# Patient Record
Sex: Female | Born: 1940
Health system: Southern US, Community
[De-identification: ages and names within clinical notes are randomized; demographics above are authoritative.]

## PROBLEM LIST (undated history)

## (undated) DIAGNOSIS — I00 Rheumatic fever without heart involvement: Secondary | ICD-10-CM

## (undated) DIAGNOSIS — E785 Hyperlipidemia, unspecified: Secondary | ICD-10-CM

## (undated) DIAGNOSIS — K635 Polyp of colon: Secondary | ICD-10-CM

## (undated) DIAGNOSIS — K5790 Diverticulosis of intestine, part unspecified, without perforation or abscess without bleeding: Secondary | ICD-10-CM

## (undated) DIAGNOSIS — G4730 Sleep apnea, unspecified: Secondary | ICD-10-CM

## (undated) DIAGNOSIS — H9190 Unspecified hearing loss, unspecified ear: Secondary | ICD-10-CM

## (undated) DIAGNOSIS — M1990 Unspecified osteoarthritis, unspecified site: Secondary | ICD-10-CM

## (undated) HISTORY — DX: Hyperlipidemia, unspecified: E78.5

## (undated) HISTORY — DX: Unspecified osteoarthritis, unspecified site: M19.90

## (undated) HISTORY — DX: Unspecified hearing loss, unspecified ear: H91.90

## (undated) HISTORY — DX: Diverticulosis of intestine, part unspecified, without perforation or abscess without bleeding: K57.90

## (undated) HISTORY — DX: Sleep apnea, unspecified: G47.30

## (undated) HISTORY — PX: COLONOSCOPY: SHX174

## (undated) HISTORY — DX: Rheumatic fever without heart involvement: I00

## (undated) HISTORY — DX: Polyp of colon: K63.5

---
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
## 1988-07-09 HISTORY — PX: BREAST EXCISIONAL BIOPSY: SUR124

## 1999-07-10 LAB — HM COLONOSCOPY

## 2010-07-05 ENCOUNTER — Ambulatory Visit: Payer: Self-pay | Admitting: Internal Medicine

## 2010-07-05 DIAGNOSIS — H6120 Impacted cerumen, unspecified ear: Secondary | ICD-10-CM | POA: Insufficient documentation

## 2010-07-05 DIAGNOSIS — E785 Hyperlipidemia, unspecified: Secondary | ICD-10-CM | POA: Insufficient documentation

## 2010-07-05 DIAGNOSIS — M1990 Unspecified osteoarthritis, unspecified site: Secondary | ICD-10-CM | POA: Insufficient documentation

## 2010-07-05 HISTORY — DX: Hyperlipidemia, unspecified: E78.5

## 2010-07-20 ENCOUNTER — Encounter: Payer: Self-pay | Admitting: Internal Medicine

## 2010-08-10 NOTE — Letter (Signed)
Summary: Records from [facility] 2009 - 2012  Records from [facility] 2009 - 2012   Imported By: Maryln Gottron 07/27/2010 11:15:56  _____________________________________________________________________  External Attachment:    Type:   Image     Comment:   External Document

## 2010-08-10 NOTE — Letter (Signed)
Summary: Patient Information form  Patient Information form   Imported By: Maryln Gottron 07/14/2010 09:09:32  _____________________________________________________________________  External Attachment:    Type:   Image     Comment:   External Document

## 2010-08-10 NOTE — Assessment & Plan Note (Signed)
Summary: PT TO RE-EST/PT REQ EMP/PT COMING IN FASTING/[facility] rsc appt tim...   Vital Signs:  Patient profile:   70 year old female Height:      65 inches Weight:      141 pounds BMI:     23.55 Temp:     98.4 degrees F oral BP sitting:   110 / 70  (left arm) Cuff size:   regular  Vitals Entered By: Duard Brady LPN (July 05, 2010 8:38 AM) CC: to re-establish  Is Patient Diabetic? No   CC:  to re-establish .  History of Present Illness: 70 -year-old patient who is seen today to establish  with our practice.  she has a long history treated dyslipidemia, and has been on Lipitor 40 mg daily.  No concerns or complaints except for some mild right ear discomfort.  Here for Medicare AWV:  1.   Risk factors based on Past M, S, F history: risk  factors include hypercholesterolemia online 2.   Physical Activities: remains quite active.  Does have some left knee discomfort;  plan on joining the [employer]. 3.   Depression/mood: no history of depression or mood disorder 4.   Hearing: uses hearing aid on the right 5.   ADL's: independent in all aspects of daily living 6.   Fall Risk: low 7.   Home Safety: no problems identified 8.   Height, weight, &visual acuity:height and weight stable.  No difficulty with visual acuity 9.   Counseling: exercise heart healthy diet.  Encouraged 10.   Labs ordered based on risk factors: will check a lipid profile next visit 11.           Referral Coordination-  12.           Care Plan- needs follow up for colonoscopy next year 13.            Cognitive Assessment- alert and oriented normal affect.  No cognitive dysfunction   Preventive Screening-Counseling & Management  Alcohol-Tobacco     Smoking Status: never  Allergies (verified): No Known Drug Allergies  Past History:  Past Medical History: Hyperlipidemia partial deafness history of rheumatic fever Osteoarthritis  Past Surgical History: Tonsillectomy 1944 breast biopsy 1980 colonoscopy  2001  Family History: Reviewed history and no changes required. mother died recently at age 71 history of Crohn's disease and valvular heart disease father is still living at 71 one brother one sister both in good health  Social History: Reviewed history and no changes required. 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  The patient denies anorexia, fever, weight loss, weight gain, vision loss, hoarseness, chest pain, syncope, dyspnea on exertion, peripheral edema, prolonged cough, headaches, hemoptysis, abdominal pain, melena, hematochezia, severe indigestion/heartburn, hematuria, incontinence, genital sores, muscle weakness, suspicious skin lesions, transient blindness, difficulty walking, depression, unusual weight change, abnormal bleeding, enlarged lymph nodes, angioedema, and breast masses.         also complains of some right ear discomfort  Physical Exam  General:  Well-developed,well-nourished,in no acute distress; alert,appropriate and cooperative throughout examination; 110/70 Head:  Normocephalic and atraumatic without obvious abnormalities. No apparent alopecia or balding. Eyes:  No corneal or conjunctival inflammation noted. EOMI. Perrla. Funduscopic exam benign, without hemorrhages, exudates or papilledema. Vision grossly normal. Ears:  External ear  exam shows no significant lesions or deformities.  Otoscopic examination reveals clear canals, tympanic membranes are intact bilaterally without bulging, retraction, inflammation or discharge. Hearing is grossly normal bilaterally. right canal occluded with cerumen Nose:  External nasal examination shows no deformity or inflammation. Nasal mucosa are pink and moist without lesions or exudates. Mouth:  Oral  mucosa and oropharynx without lesions or exudates.  Teeth in good repair. Neck:  No deformities, masses, or tenderness noted. Chest Wall:  No deformities, masses, or tenderness noted. Breasts:  No mass, nodules, thickening, tenderness, bulging, retraction, inflamation, nipple discharge or skin changes noted.   Lungs:  Normal respiratory effort, chest expands symmetrically. Lungs are clear to auscultation, no crackles or wheezes. Heart:  Normal rate and regular rhythm. S1 and S2 normal without gallop, murmur, click, rub or other extra sounds. Abdomen:  Bowel sounds positive,abdomen soft and non-tender without masses, organomegaly or hernias noted. Msk:  No deformity or scoliosis noted of thoracic or lumbar spine.   Pulses:  R and L carotid,radial,femoral,dorsalis pedis and posterior tibial pulses are full and equal bilaterally Extremities:  No clubbing, cyanosis, edema, or deformity noted with normal full range of motion of all joints.   Neurologic:  No cranial nerve deficits noted. Station and gait are normal. Plantar reflexes are down-going bilaterally. DTRs are symmetrical throughout. Sensory, motor and coordinative functions appear intact. Skin:  Intact without suspicious lesions or rashes Cervical Nodes:  No lymphadenopathy noted Axillary Nodes:  No palpable lymphadenopathy Inguinal Nodes:  No significant adenopathy Psych:  Cognition and judgment appear intact. Alert and cooperative with normal attention span and concentration. No apparent delusions, illusions, hallucinations   Impression & Recommendations:  Problem # 1:  Preventive Health Care (ICD-V70.0)  Orders: Medicare -1st Annual Wellness Visit [phone])  Problem # 2:  HYPERLIPIDEMIA (ICD-272.4)  Her updated medication list for this problem includes:    Lipitor 40 Mg Tabs (Atorvastatin calcium) ..... Qday  Her updated medication list for this problem includes:    Lipitor 40 Mg Tabs (Atorvastatin calcium) ..... Qday  Problem #  3:  OSTEOARTHRITIS (ICD-715.90)  Her updated medication list for this problem includes:    Aspirin Low Dose 81 Mg Tbec (Aspirin) ..... Qday  Her updated medication list for this problem includes:    Aspirin Low Dose 81 Mg Tbec (Aspirin) ..... Qday  Complete Medication List: 1)  Lipitor 40 Mg Tabs (Atorvastatin calcium) .... Qday 2)  Daily-vitamin Tabs (Multiple vitamin) .... Qday 3)  Vitamin D3 1000 Unit Tabs (Cholecalciferol) .... Qday 4)  Omega 3 1000  .... Two times a day 5)  Calcium 500 Mg Tabs (Calcium) .... Qday 6)  Glucosamine 500 Mg Tabs (Glucosamine) .... Two times a day 7)  Lutein 20 Mg Caps (Lutein) .... Qday 8)  Coq-10 200 Mg Caps (Coenzyme q10) .... Qday 9)  Magnesium 200 Mg Tabs (Magnesium) .... 2 qday 10)  Horsetail Extract/silica 500/35 Mg  .... Qday 11)  Aspirin Low Dose 81 Mg Tbec (Aspirin) .... Qday  Patient Instructions: 1)  Please schedule a follow-up appointment in 4 months. 2)  Advised not to eat any food or drink any liquids after 10 PM the night before your procedure. 3)  Schedule a colonoscopy/sigmoidoscopy to help detect colon cancer. 4)  Take calcium +Vitamin D daily. Prescriptions: LIPITOR 40 MG TABS (ATORVASTATIN CALCIUM) qday  #90 x 6   Entered and Authorized by:   Gordy Savers  MD   Signed by:   Gordy Savers  MD on  07/05/2010   Method used:   Print then Give to Patient   RxID:   [id number]    Orders Added: 1)  Medicare -1st Annual Wellness Visit [G0438] 2)  Est. Patient Level III [57322]   Immunization History:  Influenza Immunization History:    Influenza:  historical (05/09/2010)   Immunization History:  Influenza Immunization History:    Influenza:  Historical (05/09/2010)

## 2010-09-01 ENCOUNTER — Other Ambulatory Visit: Payer: Self-pay | Admitting: Internal Medicine

## 2010-09-01 DIAGNOSIS — Z1231 Encounter for screening mammogram for malignant neoplasm of breast: Secondary | ICD-10-CM

## 2010-09-13 ENCOUNTER — Ambulatory Visit
Admission: RE | Admit: 2010-09-13 | Discharge: 2010-09-13 | Disposition: A | Discharge status: Home / Self Care | Payer: BC Managed Care – PPO | Source: Ambulatory Visit | Attending: Internal Medicine | Admitting: Internal Medicine

## 2010-09-13 DIAGNOSIS — Z1231 Encounter for screening mammogram for malignant neoplasm of breast: Secondary | ICD-10-CM

## 2010-10-27 ENCOUNTER — Encounter: Payer: Self-pay | Admitting: Internal Medicine

## 2010-11-01 ENCOUNTER — Ambulatory Visit (INDEPENDENT_AMBULATORY_CARE_PROVIDER_SITE_OTHER): Payer: Medicare Other | Admitting: Internal Medicine

## 2010-11-01 ENCOUNTER — Encounter: Payer: Self-pay | Admitting: Internal Medicine

## 2010-11-01 DIAGNOSIS — M1990 Unspecified osteoarthritis, unspecified site: Secondary | ICD-10-CM

## 2010-11-01 DIAGNOSIS — E785 Hyperlipidemia, unspecified: Secondary | ICD-10-CM

## 2010-11-01 LAB — HEPATIC FUNCTION PANEL
ALT: 31 U/L (ref 0–35)
AST: 29 U/L (ref 0–37)
Albumin: 3.7 g/dL (ref 3.5–5.2)
Alkaline Phosphatase: 85 U/L (ref 39–117)
Total Protein: 6.7 g/dL (ref 6.0–8.3)

## 2010-11-01 LAB — CBC WITH DIFFERENTIAL/PLATELET
Eosinophils Relative: 2.6 % (ref 0.0–5.0)
HCT: 42.9 % (ref 36.0–46.0)
Hemoglobin: 14.6 g/dL (ref 12.0–15.0)
Lymphs Abs: 2.4 10*3/uL (ref 0.7–4.0)
Monocytes Relative: 6.5 % (ref 3.0–12.0)
Platelets: 257 10*3/uL (ref 150.0–400.0)
WBC: 6.2 10*3/uL (ref 4.5–10.5)

## 2010-11-01 LAB — BASIC METABOLIC PANEL
CO2: 30 mEq/L (ref 19–32)
Calcium: 9.5 mg/dL (ref 8.4–10.5)
Chloride: 105 mEq/L (ref 96–112)
Glucose, Bld: 80 mg/dL (ref 70–99)
Sodium: 142 mEq/L (ref 135–145)

## 2010-11-01 LAB — TSH: TSH: 1.35 u[IU]/mL (ref 0.35–5.50)

## 2010-11-01 LAB — LIPID PANEL: HDL: 46.5 mg/dL (ref >39.00)

## 2010-11-01 NOTE — Patient Instructions (Signed)
Limit your sodium (Salt) intake    It is important that you exercise regularly, at least 20 minutes 3 to 4 times per week.  If you develop chest pain or shortness of breath seek  medical attention.  Return in one year for follow-up   

## 2010-11-02 ENCOUNTER — Encounter: Payer: Self-pay | Admitting: Internal Medicine

## 2010-11-02 NOTE — Progress Notes (Signed)
  Subjective:    Patient ID: Tina Smith, female    DOB: 06/28/1941, 70 y.o.   MRN: [id number]  HPI  70 year old patient who is in today for followup. She has a history of dyslipidemia she remains on Lipitor 40 mg daily she is here today for fasting lab. She has a history of mild osteoarthritis which has been stable she remains on calcium and vitamin D supplementation she is doing well today without concerns or complaints.    Review of Systems  Constitutional: Negative.   HENT: Negative for hearing loss, congestion, sore throat, rhinorrhea, dental problem, sinus pressure and tinnitus.   Eyes: Negative for pain, discharge and visual disturbance.  Respiratory: Negative for cough and shortness of breath.   Cardiovascular: Negative for chest pain, palpitations and leg swelling.  Gastrointestinal: Negative for nausea, vomiting, abdominal pain, diarrhea, constipation, blood in stool and abdominal distention.  Genitourinary: Negative for dysuria, urgency, frequency, hematuria, flank pain, vaginal bleeding, vaginal discharge, difficulty urinating, vaginal pain and pelvic pain.  Musculoskeletal: Negative for joint swelling, arthralgias and gait problem.  Skin: Negative for rash.  Neurological: Negative for dizziness, syncope, speech difficulty, weakness, numbness and headaches.  Hematological: Negative for adenopathy.  Psychiatric/Behavioral: Negative for behavioral problems, dysphoric mood and agitation. The patient is not nervous/anxious.        Objective:   Physical Exam  Constitutional: She is oriented to person, place, and time. She appears well-developed and well-nourished.  HENT:  Head: Normocephalic.  Right Ear: External ear normal.  Left Ear: External ear normal.  Mouth/Throat: Oropharynx is clear and moist.  Eyes: Conjunctivae and EOM are normal. Pupils are equal, round, and reactive to light.  Neck: Normal range of motion. Neck supple. No thyromegaly present.  Cardiovascular:  Normal rate, regular rhythm, normal heart sounds and intact distal pulses.   Pulmonary/Chest: Effort normal and breath sounds normal.  Abdominal: Soft. Bowel sounds are normal. She exhibits no mass. There is no tenderness.  Musculoskeletal: Normal range of motion.  Lymphadenopathy:    She has no cervical adenopathy.  Neurological: She is alert and oriented to person, place, and time.  Skin: Skin is warm and dry. No rash noted.  Psychiatric: She has a normal mood and affect. Her behavior is normal.          Assessment & Plan:  Dyslipidemia. Patient is tolerating atorvastatin well. We'll check a fasting lipid profile Osteoarthritis. Clinically stable  We'll continue exercise calcium and vitamin D supplementation laboratory studies will be checked. We'll see in one year for a complete exam

## 2010-11-16 ENCOUNTER — Telehealth: Payer: Self-pay | Admitting: Internal Medicine

## 2010-11-16 NOTE — Telephone Encounter (Signed)
Attempt to call - Ans mach at hm# - LMTCB if questions - labs being mailed

## 2010-11-16 NOTE — Telephone Encounter (Signed)
Pt stated that Dr Kirtland Bouchard was going to get the nurse to mail her labs results from 11-01-2010. She is checking on the status.

## 2011-04-09 HISTORY — PX: CYSTOCELE REPAIR: SHX163

## 2011-04-24 ENCOUNTER — Ambulatory Visit (INDEPENDENT_AMBULATORY_CARE_PROVIDER_SITE_OTHER): Payer: Medicare Other

## 2011-04-24 DIAGNOSIS — Z23 Encounter for immunization: Secondary | ICD-10-CM

## 2011-08-16 DIAGNOSIS — L821 Other seborrheic keratosis: Secondary | ICD-10-CM | POA: Diagnosis not present

## 2011-08-16 DIAGNOSIS — L819 Disorder of pigmentation, unspecified: Secondary | ICD-10-CM | POA: Diagnosis not present

## 2011-08-16 DIAGNOSIS — D485 Neoplasm of uncertain behavior of skin: Secondary | ICD-10-CM | POA: Diagnosis not present

## 2011-08-16 DIAGNOSIS — D235 Other benign neoplasm of skin of trunk: Secondary | ICD-10-CM | POA: Diagnosis not present

## 2011-08-16 DIAGNOSIS — L439 Lichen planus, unspecified: Secondary | ICD-10-CM | POA: Diagnosis not present

## 2011-08-16 DIAGNOSIS — L570 Actinic keratosis: Secondary | ICD-10-CM | POA: Diagnosis not present

## 2011-08-17 ENCOUNTER — Other Ambulatory Visit: Payer: Self-pay | Admitting: Internal Medicine

## 2011-08-17 DIAGNOSIS — Z1239 Encounter for other screening for malignant neoplasm of breast: Secondary | ICD-10-CM

## 2011-09-18 ENCOUNTER — Ambulatory Visit
Admission: RE | Admit: 2011-09-18 | Discharge: 2011-09-18 | Disposition: A | Discharge status: Home / Self Care | Payer: Medicare Other | Source: Ambulatory Visit | Attending: Internal Medicine | Admitting: Internal Medicine

## 2011-09-18 DIAGNOSIS — Z1239 Encounter for other screening for malignant neoplasm of breast: Secondary | ICD-10-CM

## 2011-09-18 DIAGNOSIS — Z1231 Encounter for screening mammogram for malignant neoplasm of breast: Secondary | ICD-10-CM | POA: Diagnosis not present

## 2012-01-03 ENCOUNTER — Encounter: Payer: Self-pay | Admitting: Internal Medicine

## 2012-01-03 ENCOUNTER — Ambulatory Visit (INDEPENDENT_AMBULATORY_CARE_PROVIDER_SITE_OTHER): Payer: Medicare Other | Admitting: Internal Medicine

## 2012-01-03 VITALS — BP 114/70 | HR 76 | Temp 97.7°F | Ht 65.0 in | Wt 140.0 lb

## 2012-01-03 DIAGNOSIS — M1990 Unspecified osteoarthritis, unspecified site: Secondary | ICD-10-CM | POA: Diagnosis not present

## 2012-01-03 DIAGNOSIS — E785 Hyperlipidemia, unspecified: Secondary | ICD-10-CM

## 2012-01-03 DIAGNOSIS — R5381 Other malaise: Secondary | ICD-10-CM

## 2012-01-03 DIAGNOSIS — R5383 Other fatigue: Secondary | ICD-10-CM

## 2012-01-03 DIAGNOSIS — Z0000 Encounter for general adult medical examination without abnormal findings: Secondary | ICD-10-CM

## 2012-01-03 LAB — CBC WITH DIFFERENTIAL/PLATELET
Basophils Absolute: 0 10*3/uL (ref 0.0–0.1)
Eosinophils Absolute: 0.1 10*3/uL (ref 0.0–0.7)
Lymphocytes Relative: 32.7 % (ref 12.0–46.0)
MCHC: 33.2 g/dL (ref 30.0–36.0)
Monocytes Relative: 6.5 % (ref 3.0–12.0)
Neutrophils Relative %: 57.9 % (ref 43.0–77.0)
RBC: 4.62 Mil/uL (ref 3.87–5.11)
RDW: 13.7 % (ref 11.5–14.6)

## 2012-01-03 LAB — COMPREHENSIVE METABOLIC PANEL
ALT: 20 U/L (ref 0–35)
AST: 27 U/L (ref 0–37)
Albumin: 3.7 g/dL (ref 3.5–5.2)
CO2: 27 mEq/L (ref 19–32)
Calcium: 9.4 mg/dL (ref 8.4–10.5)
Chloride: 108 mEq/L (ref 96–112)
GFR: 57.92 mL/min — ABNORMAL LOW (ref >60.00)
Potassium: 4.1 mEq/L (ref 3.5–5.1)
Total Protein: 6.6 g/dL (ref 6.0–8.3)

## 2012-01-03 LAB — LIPID PANEL
Cholesterol: 278 mg/dL — ABNORMAL HIGH (ref 0–200)
HDL: 44.6 mg/dL (ref >39.00)
Total CHOL/HDL Ratio: 6
Triglycerides: 156 mg/dL — ABNORMAL HIGH (ref 0.0–149.0)
VLDL: 31.2 mg/dL (ref 0.0–40.0)

## 2012-01-03 LAB — SEDIMENTATION RATE: Sed Rate: 10 mm/hr (ref 0–22)

## 2012-01-03 NOTE — Progress Notes (Signed)
Subjective:    Patient ID: Tina Smith, female    DOB: 1940-12-21, 71 y.o.   MRN: [id number]  HPI 71 year-old patient who is in today for followup and an annual health examination. She has a history of osteoarthritis as well as dyslipidemia. She discontinued atorvastatin 3 months ago due to chronic fatigue.  The fatigue has been present for greater than 30 years and she states it is worse perhaps about 10 years ago. Approximately 30 years ago she was seen by psychiatry for possible depression and was treated with Prozac. She states that it was felt that she did not have depression. She is felt no improvement since discontinuation of statin therapy 3 months ago. She's also stopped calcium and vitamin D supplementation. She has been seen by gynecology for a cystocele and was concerned about calcium aggravating constipation issues. Her last colonoscopy 2001.  Here for Medicare AWV:   1. Risk factors based on Past M, S, F history: risk factors include hypercholesterolemia online  2. Physical Activities: remains quite active. Does have some left knee discomfort; plan on joining the Y.  3. Depression/mood: no history of depression or mood disorder;  does complain of chronic fatigue and greater than 30 years duration 4. Hearing: uses hearing aid on the right  5. ADL's: independent in all aspects of daily living  6. Fall Risk: low  7. Home Safety: no problems identified  8. Height, weight, &visual acuity:height and weight stable. No difficulty with visual acuity  9. Counseling: exercise heart healthy diet. Encouraged  10. Labs ordered based on risk factors: will check a lipid profile next visit  11. Referral Coordination-  is followup colonoscopy 12. Care Plan- needs follow up for colonoscopy next year  13. Cognitive Assessment- alert and oriented normal affect. No cognitive dysfunction   Preventive Screening-Counseling & Management  Alcohol-Tobacco  Smoking Status: never   Allergies  (verified):  No Known Drug Allergies   Past History:  Past Medical History:  Hyperlipidemia  partial deafness  history of rheumatic fever  Osteoarthritis   Past Surgical History:  Tonsillectomy 1944  breast biopsy 1980  colonoscopy 2001  Cystocele  Family History:  Reviewed history and no changes required.  mother died recently at age 68 history of Crohn's disease and valvular heart disease  father is still living at 83 one brother one sister both in good health   Social History:  Reviewed history and no changes required.  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 HYPERLIPIDEMIA 07/05/2010  . OSTEOARTHRITIS 07/05/2010  . Deafness     partial  . Rheumatic fever     hx    History   Social History  . Marital Status: Married    Spouse Name: N/A    Number of Children: N/A  . Years of Education: N/A   Occupational History  . Not on file.   Social History Main Topics  . Smoking status: Never Smoker   . Smokeless tobacco: Not on file  . Alcohol Use:   . Drug Use:   . Sexually Active:    Other Topics Concern  . Not on file   Social History Narrative  . No narrative on file    Past Surgical History  Procedure Date  . Tonsillectomy   . Breast surgery  bx    Family History  Problem Relation Age of Onset  . Heart disease Mother     No Known Allergies  Current Outpatient Prescriptions on File Prior to Visit  Medication Sig Dispense Refill  . aspirin 81 MG tablet Take 81 mg by mouth daily.        . Cholecalciferol (VITAMIN D) 1000 UNITS capsule Take 1,000 Units by mouth daily.        . Coenzyme Q10 (COQ-10) 200 MG CAPS Take 1 tablet by mouth daily.        . Glucosamine 500 MG TABS Take by mouth daily.        . Lutein 20 MG CAPS Take by mouth daily.         . Magnesium 200 MG TABS Take 2 tablets by mouth daily.        . Multiple Vitamin (MULTIVITAMIN) tablet Take 1 tablet by mouth daily.          BP 114/70  Pulse 76  Temp 97.7 F (36.5 C) (Oral)  Ht 5\' 5"  (1.651 m)  Wt 140 lb (63.504 kg)  BMI 23.30 kg/m2       Review of Systems  Constitutional: Positive for fatigue. Negative for fever, appetite change and unexpected weight change.  HENT: Negative for hearing loss, ear pain, nosebleeds, congestion, sore throat, rhinorrhea, mouth sores, trouble swallowing, neck stiffness, dental problem, voice change, sinus pressure and tinnitus.   Eyes: Negative for photophobia, pain, discharge, redness and visual disturbance.  Respiratory: Negative for cough, chest tightness and shortness of breath.   Cardiovascular: Negative for chest pain, palpitations and leg swelling.  Gastrointestinal: Negative for nausea, vomiting, abdominal pain, diarrhea, constipation, blood in stool, abdominal distention and rectal pain.  Genitourinary: Negative for dysuria, urgency, frequency, hematuria, flank pain, vaginal bleeding, vaginal discharge, difficulty urinating, genital sores, vaginal pain, menstrual problem and pelvic pain.  Musculoskeletal: Negative for back pain, joint swelling, arthralgias and gait problem.  Skin: Negative for rash.  Neurological: Positive for weakness. Negative for dizziness, syncope, speech difficulty, light-headedness, numbness and headaches.  Hematological: Negative for adenopathy. Does not bruise/bleed easily.  Psychiatric/Behavioral: Negative for suicidal ideas, behavioral problems, self-injury, dysphoric mood and agitation. The patient is not nervous/anxious.        Objective:   Physical Exam  Constitutional: She is oriented to person, place, and time. She appears well-developed and well-nourished.  HENT:  Head: Normocephalic and atraumatic.  Right Ear: External ear normal.  Left Ear: External ear normal.  Mouth/Throat:  Oropharynx is clear and moist.       Hearing aid present on the right  Eyes: Conjunctivae and EOM are normal. Pupils are equal, round, and reactive to light.  Neck: Normal range of motion. Neck supple. No JVD present. No thyromegaly present.  Cardiovascular: Normal rate, regular rhythm, normal heart sounds and intact distal pulses.   No murmur heard.      Slight decrease left dorsalis pedis pulse  Pulmonary/Chest: Effort normal and breath sounds normal. She has no wheezes. She has no rales.  Abdominal: Soft. Bowel sounds are normal. She exhibits no distension and no mass. There is no tenderness. There is no rebound and no guarding.  Musculoskeletal: Normal range of motion. She exhibits no edema and no tenderness.  Lymphadenopathy:    She has no cervical adenopathy.  Neurological: She is alert and oriented to person, place, and time. She has normal reflexes. No cranial nerve deficit. She exhibits normal muscle tone. Coordination normal.  Skin: Skin  is warm and dry. No rash noted.  Psychiatric: She has a normal mood and affect. Her behavior is normal.          Assessment & Plan:  Dyslipidemia. We'll check a fasting lipid profile Osteoarthritis. Clinically stable  We'll check a followup colonoscopy Schedule a bone density study  We'll continue exercise calcium and vitamin D supplementation laboratory studies will be checked. We'll see in one year for a complete exam

## 2012-01-03 NOTE — Patient Instructions (Signed)
Colonoscopy and bone density study as discussed  Take a calcium supplement, plus [phone] units of vitamin D    It is important that you exercise regularly, at least 20 minutes 3 to 4 times per week.  If you develop chest pain or shortness of breath seek  medical attention.  Return in one year for follow-up

## 2012-01-04 ENCOUNTER — Encounter: Payer: Self-pay | Admitting: Internal Medicine

## 2012-01-14 ENCOUNTER — Telehealth: Payer: Self-pay | Admitting: Internal Medicine

## 2012-01-14 NOTE — Telephone Encounter (Signed)
Pt would like blood work results °

## 2012-01-29 NOTE — Telephone Encounter (Signed)
Pease advise.

## 2012-01-29 NOTE — Telephone Encounter (Signed)
Please call/notify patient that lab/test/procedure is normal except cholesterol is up to 279. This is 100 points greater than her last total cholesterol. Suggest  she resume atorvastatin. Call in a prescription if needed

## 2012-01-29 NOTE — Telephone Encounter (Signed)
Attempt to call - no ans no mach "please enter a remote access code" - i have no code to enter. Will try tomorrow

## 2012-01-30 NOTE — Telephone Encounter (Signed)
I have been unable to reach by phone - letter sent with results

## 2012-02-12 ENCOUNTER — Ambulatory Visit (AMBULATORY_SURGERY_CENTER): Payer: Medicare Other | Admitting: *Deleted

## 2012-02-12 ENCOUNTER — Ambulatory Visit (INDEPENDENT_AMBULATORY_CARE_PROVIDER_SITE_OTHER)
Admission: RE | Admit: 2012-02-12 | Discharge: 2012-02-12 | Disposition: A | Discharge status: Home / Self Care | Payer: Medicare Other | Source: Ambulatory Visit

## 2012-02-12 VITALS — Ht 65.0 in | Wt 140.1 lb

## 2012-02-12 DIAGNOSIS — M810 Age-related osteoporosis without current pathological fracture: Secondary | ICD-10-CM

## 2012-02-12 DIAGNOSIS — Z0000 Encounter for general adult medical examination without abnormal findings: Secondary | ICD-10-CM

## 2012-02-12 DIAGNOSIS — Z1211 Encounter for screening for malignant neoplasm of colon: Secondary | ICD-10-CM

## 2012-02-12 MED ORDER — MOVIPREP 100 G PO SOLR: ORAL | Status: DC

## 2012-02-14 DIAGNOSIS — H2510 Age-related nuclear cataract, unspecified eye: Secondary | ICD-10-CM | POA: Diagnosis not present

## 2012-02-14 DIAGNOSIS — H43399 Other vitreous opacities, unspecified eye: Secondary | ICD-10-CM | POA: Diagnosis not present

## 2012-02-26 ENCOUNTER — Ambulatory Visit (AMBULATORY_SURGERY_CENTER): Payer: Medicare Other | Admitting: Internal Medicine

## 2012-02-26 ENCOUNTER — Encounter: Payer: Self-pay | Admitting: Internal Medicine

## 2012-02-26 VITALS — BP 140/83 | HR 56 | Temp 96.0°F | Resp 15 | Ht 65.0 in | Wt 140.0 lb

## 2012-02-26 DIAGNOSIS — Z1211 Encounter for screening for malignant neoplasm of colon: Secondary | ICD-10-CM | POA: Diagnosis not present

## 2012-02-26 DIAGNOSIS — K5730 Diverticulosis of large intestine without perforation or abscess without bleeding: Secondary | ICD-10-CM

## 2012-02-26 DIAGNOSIS — D126 Benign neoplasm of colon, unspecified: Secondary | ICD-10-CM | POA: Diagnosis not present

## 2012-02-26 DIAGNOSIS — G4733 Obstructive sleep apnea (adult) (pediatric): Secondary | ICD-10-CM | POA: Diagnosis not present

## 2012-02-26 DIAGNOSIS — K635 Polyp of colon: Secondary | ICD-10-CM

## 2012-02-26 DIAGNOSIS — K648 Other hemorrhoids: Secondary | ICD-10-CM

## 2012-02-26 DIAGNOSIS — K5790 Diverticulosis of intestine, part unspecified, without perforation or abscess without bleeding: Secondary | ICD-10-CM

## 2012-02-26 DIAGNOSIS — D128 Benign neoplasm of rectum: Secondary | ICD-10-CM | POA: Diagnosis not present

## 2012-02-26 DIAGNOSIS — D129 Benign neoplasm of anus and anal canal: Secondary | ICD-10-CM

## 2012-02-26 DIAGNOSIS — M810 Age-related osteoporosis without current pathological fracture: Secondary | ICD-10-CM | POA: Diagnosis not present

## 2012-02-26 DIAGNOSIS — E785 Hyperlipidemia, unspecified: Secondary | ICD-10-CM | POA: Diagnosis not present

## 2012-02-26 HISTORY — DX: Diverticulosis of intestine, part unspecified, without perforation or abscess without bleeding: K57.90

## 2012-02-26 HISTORY — DX: Polyp of colon: K63.5

## 2012-02-26 MED ORDER — SODIUM CHLORIDE 0.9 % IV SOLN: 500.0000 mL | INTRAVENOUS | Status: DC

## 2012-02-26 NOTE — Patient Instructions (Addendum)
Three small polyps were removed. I think they are benign and not a problem. I will send a letter with pathology results and plans. You also have diverticulosis and hemorrhoids.  Thank you for choosing me and [facility].  Iva Boop, MD, FACG YOU HAD AN ENDOSCOPIC PROCEDURE TODAY AT THE [facility]: Refer to the procedure report that was given to you for any specific questions about what was found during the examination.  If the procedure report does not answer your questions, please call your gastroenterologist to clarify.  If you requested that your care partner not be given the details of your procedure findings, then the procedure report has been included in a sealed envelope for you to review at your convenience later.  YOU SHOULD EXPECT: Some feelings of bloating in the abdomen. Passage of more gas than usual.  Walking can help get rid of the air that was put into your GI tract during the procedure and reduce the bloating. If you had a lower endoscopy (such as a colonoscopy or flexible sigmoidoscopy) you may notice spotting of blood in your stool or on the toilet paper. If you underwent a bowel prep for your procedure, then you may not have a normal bowel movement for a few days.  DIET: Your first meal following the procedure should be a light meal and then it is ok to progress to your normal diet.  A half-sandwich or bowl of soup is an example of a good first meal.  Heavy or fried foods are harder to digest and may make you feel nauseous or bloated.  Likewise meals heavy in dairy and vegetables can cause extra gas to form and this can also increase the bloating.  Drink plenty of fluids but you should avoid alcoholic beverages for 24 hours.  ACTIVITY: Your care partner should take you home directly after the procedure.  You should plan to take it easy, moving slowly for the rest of the day.  You can resume normal activity the day after the procedure however you should  NOT DRIVE or use heavy machinery for 24 hours (because of the sedation medicines used during the test).    SYMPTOMS TO REPORT IMMEDIATELY: A gastroenterologist can be reached at any hour.  During normal business hours, 8:30 AM to 5:00 PM Monday through Friday, call [phone].  After hours and on weekends, please call the GI answering service at [phone] who will take a message and have the physician on call contact you.   Following lower endoscopy (colonoscopy or flexible sigmoidoscopy):  Excessive amounts of blood in the stool  Significant tenderness or worsening of abdominal pains  Swelling of the abdomen that is new, acute  Fever of 100F or higher  Following upper endoscopy (EGD)  Vomiting of blood or coffee ground material  New chest pain or pain under the shoulder blades  Painful or persistently difficult swallowing  New shortness of breath  Fever of 100F or higher  Black, tarry-looking stools  FOLLOW UP: If any biopsies were taken you will be contacted by phone or by letter within the next 1-3 weeks.  Call your gastroenterologist if you have not heard about the biopsies in 3 weeks.  Our staff will call the home number listed on your records the next business day following your procedure to check on you and address any questions or concerns that you may have at that time regarding the information given to you following your procedure. This is a Research officer, political party  call and so if there is no answer at the home number and we have not heard from you through the emergency physician on call, we will assume that you have returned to your regular daily activities without incident.  SIGNATURES/CONFIDENTIALITY: You and/or your care partner have signed paperwork which will be entered into your electronic medical record.  These signatures attest to the fact that that the information above on your After Visit Summary has been reviewed and is understood.  Full responsibility of the confidentiality  of this discharge information lies with you and/or your care-partner.

## 2012-02-26 NOTE — Op Note (Signed)
[facility] [address] [place], [address]   COLONOSCOPY PROCEDURE REPORT  PATIENT: Tina Smith, Tina Smith  MR#: [id number] BIRTHDATE: [birth year] , 71  yrs. old GENDER: Female ENDOSCOPIST: Iva Boop, MD, [facility] REFERRED QI:ONGEX Amador Cunas, M.D. PROCEDURE DATE:  02/26/2012 PROCEDURE:   Colonoscopy with cold biopsy polypectomy ASA CLASS:   Class II INDICATIONS:average risk screening. MEDICATIONS: propofol (Diprivan) 250mg  IV, MAC sedation, administered by CRNA, and These medications were titrated to patient response per physician's verbal order  DESCRIPTION OF PROCEDURE:   After the risks benefits and alternatives of the procedure were thoroughly explained, informed consent was obtained.  A digital rectal exam revealed no abnormalities of the rectum.   The LB [id number]  endoscope was introduced through the anus and advanced to the cecum, which was identified by both the appendix and ileocecal valve. No adverse events experienced.   The quality of the prep was excellent, using MoviPrep  The instrument was then slowly withdrawn as the colon was fully examined.      COLON FINDINGS: Two diminutive polyps were found in the rectum.  A polypectomy was performed with cold forceps.  The resection was complete and the polyp tissue was completely retrieved.   A 5mm sessile polyp was found in the sigmoid colon.  A polypectomy was performed with a cold snare.  The resection was complete and the polyp tissue was completely retrieved.   Moderate diverticulosis was noted in the sigmoid colon.   The colon mucosa was otherwise normal.    Retroflexed views revealed internal hemorrhoid.  The time to cecum = 4:35 minutes  Withdrawal time= 13:13 minutes and the procedure completed. COMPLICATIONS: There were no complications. ENDOSCOPIC IMPRESSION: 1.   Two diminutive polyps were found in the rectum; polypectomy was performed with cold forceps 2.   Sessile polyp was  found in the sigmoid colon; polypectomy was performed with a cold snare 3.   Moderate diverticulosis was noted in the sigmoid colon 4.   The colon mucosa was otherwise normal , excellent prep 5.   Internal hemorrhoids  RECOMMENDATIONS: Await pathology results  eSigned:  Iva Boop, MD, [facility] 02/26/2012 10:30 AM   BM:WUXLK Lysle Dingwall, MD The Patient   PATIENT NAME:  Tina Smith, Tina Smith MR#: [id number]

## 2012-02-26 NOTE — Progress Notes (Signed)
Patient did not experience any of the following events: a burn prior to discharge; a fall within the facility; wrong site/side/patient/procedure/implant event; or a hospital transfer or hospital admission upon discharge from the facility. (G8907) Patient did not have preoperative order for IV antibiotic SSI prophylaxis. (G8918)  

## 2012-02-27 ENCOUNTER — Telehealth: Payer: Self-pay | Admitting: *Deleted

## 2012-02-27 NOTE — Telephone Encounter (Signed)
No answer, message left for the patient. 

## 2012-03-05 ENCOUNTER — Encounter: Payer: Self-pay | Admitting: Internal Medicine

## 2012-03-05 DIAGNOSIS — Z86010 Personal history of colonic polyps: Secondary | ICD-10-CM | POA: Insufficient documentation

## 2012-03-05 NOTE — Progress Notes (Signed)
Quick Note:  1 tubular adenoma - small Repeat colon 5 years about 03/2017 ______

## 2012-04-15 DIAGNOSIS — Z23 Encounter for immunization: Secondary | ICD-10-CM | POA: Diagnosis not present

## 2012-04-22 ENCOUNTER — Encounter: Payer: Self-pay | Admitting: Internal Medicine

## 2012-09-10 ENCOUNTER — Other Ambulatory Visit: Payer: Self-pay

## 2012-09-10 DIAGNOSIS — Z1231 Encounter for screening mammogram for malignant neoplasm of breast: Secondary | ICD-10-CM

## 2012-10-16 ENCOUNTER — Ambulatory Visit
Admission: RE | Admit: 2012-10-16 | Discharge: 2012-10-16 | Disposition: A | Discharge status: Home / Self Care | Payer: Medicare Other | Source: Ambulatory Visit

## 2012-10-16 DIAGNOSIS — Z1231 Encounter for screening mammogram for malignant neoplasm of breast: Secondary | ICD-10-CM

## 2013-02-03 ENCOUNTER — Encounter: Payer: Self-pay | Admitting: Internal Medicine

## 2013-02-03 ENCOUNTER — Ambulatory Visit (INDEPENDENT_AMBULATORY_CARE_PROVIDER_SITE_OTHER): Payer: Medicare Other | Admitting: Internal Medicine

## 2013-02-03 VITALS — BP 110/70 | HR 66 | Temp 98.5°F | Resp 16 | Ht 65.5 in | Wt 142.0 lb

## 2013-02-03 DIAGNOSIS — E785 Hyperlipidemia, unspecified: Secondary | ICD-10-CM

## 2013-02-03 DIAGNOSIS — M899 Disorder of bone, unspecified: Secondary | ICD-10-CM

## 2013-02-03 DIAGNOSIS — M1990 Unspecified osteoarthritis, unspecified site: Secondary | ICD-10-CM | POA: Diagnosis not present

## 2013-02-03 DIAGNOSIS — R5381 Other malaise: Secondary | ICD-10-CM

## 2013-02-03 DIAGNOSIS — Z0000 Encounter for general adult medical examination without abnormal findings: Secondary | ICD-10-CM | POA: Diagnosis not present

## 2013-02-03 DIAGNOSIS — R5383 Other fatigue: Secondary | ICD-10-CM | POA: Diagnosis not present

## 2013-02-03 DIAGNOSIS — M8580 Other specified disorders of bone density and structure, unspecified site: Secondary | ICD-10-CM | POA: Insufficient documentation

## 2013-02-03 DIAGNOSIS — Z86010 Personal history of colonic polyps: Secondary | ICD-10-CM

## 2013-02-03 LAB — SEDIMENTATION RATE: Sed Rate: 48 mm/hr — ABNORMAL HIGH (ref 0–22)

## 2013-02-03 LAB — CBC WITH DIFFERENTIAL/PLATELET
Eosinophils Relative: 2.5 % (ref 0.0–5.0)
HCT: 43.3 % (ref 36.0–46.0)
Lymphs Abs: 2 10*3/uL (ref 0.7–4.0)
MCV: 90.8 fl (ref 78.0–100.0)
Monocytes Absolute: 0.3 10*3/uL (ref 0.1–1.0)
Platelets: 282 10*3/uL (ref 150.0–400.0)
WBC: 5 10*3/uL (ref 4.5–10.5)

## 2013-02-03 LAB — LDL CHOLESTEROL, DIRECT: Direct LDL: 234.7 mg/dL

## 2013-02-03 LAB — TSH: TSH: 5.26 u[IU]/mL (ref 0.35–5.50)

## 2013-02-03 LAB — COMPREHENSIVE METABOLIC PANEL
Alkaline Phosphatase: 71 U/L (ref 39–117)
BUN: 14 mg/dL (ref 6–23)
Glucose, Bld: 86 mg/dL (ref 70–99)
Sodium: 141 mEq/L (ref 135–145)
Total Bilirubin: 0.6 mg/dL (ref 0.3–1.2)
Total Protein: 6.8 g/dL (ref 6.0–8.3)

## 2013-02-03 LAB — T3, FREE: T3, Free: 3.1 pg/mL (ref 2.3–4.2)

## 2013-02-03 LAB — T4, FREE: Free T4: 0.77 ng/dL (ref 0.60–1.60)

## 2013-02-03 LAB — LIPID PANEL
Cholesterol: 312 mg/dL — ABNORMAL HIGH (ref 0–200)
HDL: 39.3 mg/dL (ref >39.00)

## 2013-02-03 LAB — CORTISOL: Cortisol, Plasma: 15.1 ug/dL

## 2013-02-03 NOTE — Patient Instructions (Signed)
Take a calcium supplement, plus 800-1200 units of vitamin D    It is important that you exercise regularly, at least 20 minutes 3 to 4 times per week.  If you develop chest pain or shortness of breath seek  medical attention.  Return in one year for follow-up   

## 2013-02-03 NOTE — Progress Notes (Signed)
Patient ID: Tina Smith, female   DOB: [birth year], 72 y.o.   MRN: [id number]  Subjective:    Patient ID: Tina Smith, female    DOB: Feb 07, 1941, 72 y.o.   MRN: [id number]  HPI 72  year-old patient who is in today for followup and an annual health examination. She has a history of osteoarthritis as well as dyslipidemia. She discontinued atorvastatin last year  due to chronic fatigue.  The fatigue has been present for greater than 30 years and she states it is worse perhaps for  about 10 years ago. Approximately 30 years ago she was seen by psychiatry for possible depression and was treated with Prozac. She states that it was felt that she did not have depression. She is felt no improvement since discontinuation of statin therapy. She's also stopped calcium and vitamin D supplementation. She has been seen by gynecology for a cystocele and was concerned about calcium aggravating constipation issues. Her last colonoscopy 2013.  Here for Medicare AWV:   1. Risk factors based on Past M, S, F history: risk factors include hypercholesterolemia  2. Physical Activities: remains quite active. Does have some left knee discomfort; plan on joining [employer]  3. Depression/mood: no history of depression or mood disorder;  does complain of chronic fatigue and greater than 30 years duration 4. Hearing: uses hearing aid on the right  5. ADL's: independent in all aspects of daily living  6. Fall Risk: low  7. Home Safety: no problems identified  8. Height, weight, &visual acuity:height and weight stable. No difficulty with visual acuity  9. Counseling: exercise heart healthy diet. Encouraged  10. Labs ordered based on risk factors: will check a lipid profile next visit  11. Referral Coordination-  is followup colonoscopy 12. Care Plan- needs follow up for colonoscopy next year  13. Cognitive Assessment- alert and oriented normal affect. No cognitive dysfunction   Preventive Screening-Counseling & Management   Alcohol-Tobacco  Smoking Status: never   Allergies (verified):  No Known Drug Allergies   Past History:  Past Medical History:  Hyperlipidemia  partial deafness  history of rheumatic fever  Osteoarthritis  Colonic polyps History of mild OSA-does have a CPAP machine  Past Surgical History:  Tonsillectomy 1944  breast biopsy 1980  colonoscopy 2001 2013 Cystocele  Family History:  Reviewed history and no changes required.  mother died recently at age 60 history of Crohn's disease and valvular heart disease  father is still living at 98 one brother one sister both in good health Mother died at 5   Social History:  Reviewed history and no changes required.  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 HYPERLIPIDEMIA 07/05/2010  . OSTEOARTHRITIS 07/05/2010  . Deafness     partial  . Rheumatic fever     hx  . Sleep apnea     does not tolerate CPAP    History   Social History  . Marital Status: Married    Spouse Name: N/A    Number of Children: N/A  . Years of Education: N/A   Occupational History  . Not on file.   Social History Main Topics  . Smoking status: Never Smoker   . Smokeless tobacco: Never Used  . Alcohol Use: Yes     Comment: occasional  .  Drug Use: No  . Sexually Active: Not on file   Other Topics Concern  . Not on file   Social History Narrative  . No narrative on file    Past Surgical History  Procedure Laterality Date  . Tonsillectomy  1944  . Breast surgery  1960    bx  . Colonoscopy  2003, 2013    2003 - [place], int hemorrhoids, 2013 -     Family History  Problem Relation Age of Onset  . Heart disease Mother   . Colitis Mother   . Colon cancer Neg Hx   . Stomach cancer Neg Hx     No Known Allergies  Current Outpatient  Prescriptions on File Prior to Visit  Medication Sig Dispense Refill  . aspirin 81 MG tablet Take 81 mg by mouth daily.        . Cholecalciferol (VITAMIN D) 1000 UNITS capsule Take 2,000 Units by mouth daily.       Marland Kitchen gelatin 650 MG capsule Take 650 mg by mouth daily.      . Glucosamine 500 MG TABS Take by mouth daily.        . Magnesium 200 MG TABS Take 2 tablets by mouth daily.        . Methylcobalamin (B-12) 5000 MCG TBDP Take 1 tablet by mouth daily.      . Multiple Vitamin (MULTIVITAMIN) tablet Take 1 tablet by mouth daily.        . Pregnenolone POWD by Does not apply route daily.      Marland Kitchen UNABLE TO FIND Med Name: Iodine Complex daily      . UNABLE TO FIND Med Name: Smiley Houseman Sterols.  400 mg twice daily       No current facility-administered medications on file prior to visit.    BP 110/70  Pulse 66  Temp(Src) 98.5 F (36.9 C) (Oral)  Resp 16  Ht 5' 5.5" (1.664 m)  Wt 142 lb (64.411 kg)  BMI 23.26 kg/m2  SpO2 95%       Review of Systems  Constitutional: Positive for fatigue. Negative for fever, appetite change and unexpected weight change.  HENT: Negative for hearing loss, ear pain, nosebleeds, congestion, sore throat, rhinorrhea, mouth sores, trouble swallowing, neck stiffness, dental problem, voice change, sinus pressure and tinnitus.   Eyes: Negative for photophobia, pain, discharge, redness and visual disturbance.  Respiratory: Negative for cough, chest tightness and shortness of breath.   Cardiovascular: Negative for chest pain, palpitations and leg swelling.  Gastrointestinal: Negative for nausea, vomiting, abdominal pain, diarrhea, constipation, blood in stool, abdominal distention and rectal pain.  Genitourinary: Negative for dysuria, urgency, frequency, hematuria, flank pain, vaginal bleeding, vaginal discharge, difficulty urinating, genital sores, vaginal pain, menstrual problem and pelvic pain.  Musculoskeletal: Negative for back pain, joint swelling,  arthralgias and gait problem.  Skin: Negative for rash.  Neurological: Positive for weakness. Negative for dizziness, syncope, speech difficulty, light-headedness, numbness and headaches.  Hematological: Negative for adenopathy. Does not bruise/bleed easily.  Psychiatric/Behavioral: Negative for suicidal ideas, behavioral problems, self-injury, dysphoric mood and agitation. The patient is not nervous/anxious.        Objective:   Physical Exam  Constitutional: She is oriented to person, place, and time. She appears well-developed and well-nourished.  HENT:  Head: Normocephalic and atraumatic.  Right Ear: External ear normal.  Left Ear: External ear normal.  Mouth/Throat: Oropharynx is clear and moist.  Hearing aid present on the right  Eyes: Conjunctivae and EOM  are normal. Pupils are equal, round, and reactive to light.  Neck: Normal range of motion. Neck supple. No JVD present. No thyromegaly present.  Cardiovascular: Normal rate, regular rhythm, normal heart sounds and intact distal pulses.   No murmur heard. Slight decrease left dorsalis pedis pulse  Pulmonary/Chest: Effort normal and breath sounds normal. She has no wheezes. She has no rales.  Abdominal: Soft. Bowel sounds are normal. She exhibits no distension and no mass. There is no tenderness. There is no rebound and no guarding.  Musculoskeletal: Normal range of motion. She exhibits no edema and no tenderness.  Lymphadenopathy:    She has no cervical adenopathy.  Neurological: She is alert and oriented to person, place, and time. She has normal reflexes. No cranial nerve deficit. She exhibits normal muscle tone. Coordination normal.  Skin: Skin is warm and dry. No rash noted.  Psychiatric: She has a normal mood and affect. Her behavior is normal.          Assessment & Plan:  Dyslipidemia. We'll check a fasting lipid profile Osteoarthritis. Clinically stable  Preventive health exam Schedule a bone density  study  We'll continue exercise calcium and vitamin D supplementation laboratory studies will be checked. We'll see in one year for a complete exam

## 2013-02-04 ENCOUNTER — Other Ambulatory Visit: Payer: Self-pay | Admitting: *Deleted

## 2013-02-04 MED ORDER — ATORVASTATIN CALCIUM 40 MG PO TABS: 40.0000 mg | ORAL_TABLET | Freq: Every day | ORAL | Status: DC

## 2013-02-11 ENCOUNTER — Other Ambulatory Visit: Payer: Self-pay

## 2013-05-05 ENCOUNTER — Encounter: Payer: Self-pay | Admitting: Internal Medicine

## 2013-05-12 ENCOUNTER — Encounter: Payer: Self-pay | Admitting: Internal Medicine

## 2013-05-12 ENCOUNTER — Ambulatory Visit (INDEPENDENT_AMBULATORY_CARE_PROVIDER_SITE_OTHER): Payer: Medicare Other | Admitting: Internal Medicine

## 2013-05-12 VITALS — BP 118/70 | HR 67 | Temp 97.9°F | Resp 20 | Wt 145.0 lb

## 2013-05-12 DIAGNOSIS — M899 Disorder of bone, unspecified: Secondary | ICD-10-CM

## 2013-05-12 DIAGNOSIS — Z23 Encounter for immunization: Secondary | ICD-10-CM

## 2013-05-12 DIAGNOSIS — M8580 Other specified disorders of bone density and structure, unspecified site: Secondary | ICD-10-CM

## 2013-05-12 DIAGNOSIS — E785 Hyperlipidemia, unspecified: Secondary | ICD-10-CM | POA: Diagnosis not present

## 2013-05-12 LAB — LIPID PANEL: Cholesterol: 207 mg/dL — ABNORMAL HIGH (ref 0–200)

## 2013-05-12 NOTE — Progress Notes (Signed)
Subjective:    Patient ID: Tina Smith, female    DOB: 1940-12-08, 72 y.o.   MRN: [id number] Pre-visit discussion using our clinic review tool. No additional management support is needed unless otherwise documented below in the visit note.  HPI 72 year old patient is seen today for followup. She has dyslipidemia And a recent lipid panel was performed in July this revealed a LDL of greater than 200 and statin therapy was resumed. She was placed on atorvastatin 40 mg daily but due to concerns of side effects has down titrated to 20 mg every other day. She has been on statins in the past apparently without side effects. Her family is known for longevity with both parents lived into their 41s. No other cardiac risk factors.  Past Medical History  Diagnosis Date  . HYPERLIPIDEMIA 07/05/2010  . OSTEOARTHRITIS 07/05/2010  . Deafness     partial  . Rheumatic fever     hx  . Sleep apnea     does not tolerate CPAP    History   Social History  . Marital Status: Married    Spouse Name: N/A    Number of Children: N/A  . Years of Education: N/A   Occupational History  . Not on file.   Social History Main Topics  . Smoking status: Never Smoker   . Smokeless tobacco: Never Used  . Alcohol Use: Yes     Comment: occasional  . Drug Use: No  . Sexual Activity: Not on file   Other Topics Concern  . Not on file   Social History Narrative  . No narrative on file    Past Surgical History  Procedure Laterality Date  . Tonsillectomy  1944  . Breast surgery  1960    bx  . Colonoscopy  2003, 2013    2003 - [place], int hemorrhoids, 2013 -     Family History  Problem Relation Age of Onset  . Heart disease Mother   . Colitis Mother   . Colon cancer Neg Hx   . Stomach cancer Neg Hx     No Known Allergies  Current Outpatient Prescriptions on File Prior to Visit  Medication Sig Dispense Refill  . aspirin 81 MG tablet Take 81 mg by mouth daily.        . Cholecalciferol (VITAMIN D)  1000 UNITS capsule Take 2,000 Units by mouth daily.       Marland Kitchen gelatin 650 MG capsule Take 650 mg by mouth daily.      . Glucosamine 500 MG TABS Take by mouth daily.        . Magnesium 200 MG TABS Take 2 tablets by mouth daily.        . Multiple Vitamin (MULTIVITAMIN) tablet Take 1 tablet by mouth daily.         No current facility-administered medications on file prior to visit.    BP 118/70  Pulse 67  Temp(Src) 97.9 F (36.6 C) (Oral)  Resp 20  Wt 145 lb (65.772 kg)  SpO2 97%        Review of Systems  Constitutional: Negative.   HENT: Negative for congestion, dental problem, hearing loss, rhinorrhea, sinus pressure, sore throat and tinnitus.   Eyes: Negative for pain, discharge and visual disturbance.  Respiratory: Negative for cough and shortness of breath.   Cardiovascular: Negative for chest pain, palpitations and leg swelling.  Gastrointestinal: Negative for nausea, vomiting, abdominal pain, diarrhea, constipation, blood in stool and abdominal distention.  Genitourinary: Negative for  dysuria, urgency, frequency, hematuria, flank pain, vaginal bleeding, vaginal discharge, difficulty urinating, vaginal pain and pelvic pain.  Musculoskeletal: Negative for arthralgias, gait problem and joint swelling.  Skin: Negative for rash.  Neurological: Negative for dizziness, syncope, speech difficulty, weakness, numbness and headaches.  Hematological: Negative for adenopathy.  Psychiatric/Behavioral: Negative for behavioral problems, dysphoric mood and agitation. The patient is not nervous/anxious.        Objective:   Physical Exam  Constitutional: She appears well-developed and well-nourished. No distress.  Blood pressure low normal  Psychiatric: She has a normal mood and affect. Her behavior is normal. Judgment and thought content normal.          Assessment & Plan:  Dyslipidemia. Risks and benefits of statin therapy discussed. We'll recheck a lipid profile Preventative  health. Declines flu vaccine or Pneumovax  Recheck one year

## 2013-05-12 NOTE — Patient Instructions (Signed)
Limit your sodium (Salt) intake    It is important that you exercise regularly, at least 20 minutes 3 to 4 times per week.  If you develop chest pain or shortness of breath seek  medical attention.  Return in one year for follow-up   

## 2013-06-17 DIAGNOSIS — Z124 Encounter for screening for malignant neoplasm of cervix: Secondary | ICD-10-CM | POA: Diagnosis not present

## 2013-06-17 DIAGNOSIS — Z01419 Encounter for gynecological examination (general) (routine) without abnormal findings: Secondary | ICD-10-CM | POA: Diagnosis not present

## 2013-08-14 ENCOUNTER — Encounter: Payer: Self-pay | Admitting: Internal Medicine

## 2013-09-18 ENCOUNTER — Other Ambulatory Visit: Payer: Self-pay

## 2013-09-18 DIAGNOSIS — Z1231 Encounter for screening mammogram for malignant neoplasm of breast: Secondary | ICD-10-CM

## 2013-10-20 ENCOUNTER — Ambulatory Visit
Admission: RE | Admit: 2013-10-20 | Discharge: 2013-10-20 | Disposition: A | Discharge status: Home / Self Care | Payer: Medicare Other | Source: Ambulatory Visit

## 2013-10-20 DIAGNOSIS — Z1231 Encounter for screening mammogram for malignant neoplasm of breast: Secondary | ICD-10-CM

## 2013-10-22 ENCOUNTER — Other Ambulatory Visit: Payer: Self-pay | Admitting: Internal Medicine

## 2013-10-22 DIAGNOSIS — R928 Other abnormal and inconclusive findings on diagnostic imaging of breast: Secondary | ICD-10-CM

## 2013-12-17 DIAGNOSIS — H52209 Unspecified astigmatism, unspecified eye: Secondary | ICD-10-CM | POA: Diagnosis not present

## 2013-12-17 DIAGNOSIS — H524 Presbyopia: Secondary | ICD-10-CM | POA: Diagnosis not present

## 2013-12-17 DIAGNOSIS — H2510 Age-related nuclear cataract, unspecified eye: Secondary | ICD-10-CM | POA: Diagnosis not present

## 2013-12-17 DIAGNOSIS — H43819 Vitreous degeneration, unspecified eye: Secondary | ICD-10-CM | POA: Diagnosis not present

## 2014-02-18 DIAGNOSIS — L908 Other atrophic disorders of skin: Secondary | ICD-10-CM | POA: Diagnosis not present

## 2014-02-18 DIAGNOSIS — L918 Other hypertrophic disorders of the skin: Secondary | ICD-10-CM | POA: Diagnosis not present

## 2014-02-18 DIAGNOSIS — D235 Other benign neoplasm of skin of trunk: Secondary | ICD-10-CM | POA: Diagnosis not present

## 2014-02-18 DIAGNOSIS — L820 Inflamed seborrheic keratosis: Secondary | ICD-10-CM | POA: Diagnosis not present

## 2014-02-18 DIAGNOSIS — I781 Nevus, non-neoplastic: Secondary | ICD-10-CM | POA: Diagnosis not present

## 2014-04-12 ENCOUNTER — Telehealth: Payer: Self-pay | Admitting: Internal Medicine

## 2014-04-12 NOTE — Telephone Encounter (Signed)
Yes, but not 8:00 am.

## 2014-04-12 NOTE — Telephone Encounter (Signed)
Pt would like to have a cpe before the end of the year. Pt must fast and all early am cpe appts are booked up until Feb 2016. Can I work this pt in somewhere where there is not too many appts?

## 2014-04-15 ENCOUNTER — Encounter: Payer: Self-pay | Admitting: Internal Medicine

## 2014-04-15 NOTE — Telephone Encounter (Signed)
Lm on VM for pt to cc and sche her cpe.  Not at 8am.  Pt must fast.

## 2014-04-16 NOTE — Telephone Encounter (Signed)
Pt has been scheduled.  °

## 2014-05-13 ENCOUNTER — Other Ambulatory Visit: Payer: Self-pay | Admitting: *Deleted

## 2014-05-13 ENCOUNTER — Ambulatory Visit (INDEPENDENT_AMBULATORY_CARE_PROVIDER_SITE_OTHER): Payer: Medicare Other | Admitting: Internal Medicine

## 2014-05-13 ENCOUNTER — Encounter: Payer: Self-pay | Admitting: Internal Medicine

## 2014-05-13 VITALS — BP 110/70 | HR 81 | Temp 98.0°F | Resp 20 | Ht 65.0 in | Wt 140.0 lb

## 2014-05-13 DIAGNOSIS — M8580 Other specified disorders of bone density and structure, unspecified site: Secondary | ICD-10-CM | POA: Diagnosis not present

## 2014-05-13 DIAGNOSIS — Z86010 Personal history of colonic polyps: Secondary | ICD-10-CM | POA: Diagnosis not present

## 2014-05-13 DIAGNOSIS — M150 Primary generalized (osteo)arthritis: Secondary | ICD-10-CM

## 2014-05-13 DIAGNOSIS — Z23 Encounter for immunization: Secondary | ICD-10-CM

## 2014-05-13 DIAGNOSIS — Z0000 Encounter for general adult medical examination without abnormal findings: Secondary | ICD-10-CM | POA: Diagnosis not present

## 2014-05-13 DIAGNOSIS — E785 Hyperlipidemia, unspecified: Secondary | ICD-10-CM

## 2014-05-13 DIAGNOSIS — M159 Polyosteoarthritis, unspecified: Secondary | ICD-10-CM

## 2014-05-13 LAB — CBC WITH DIFFERENTIAL/PLATELET
BASOS ABS: 0.1 10*3/uL (ref 0.0–0.1)
Basophils Relative: 1.1 % (ref 0.0–3.0)
Eosinophils Absolute: 0.2 10*3/uL (ref 0.0–0.7)
Eosinophils Relative: 3.7 % (ref 0.0–5.0)
HCT: 42.5 % (ref 36.0–46.0)
HEMOGLOBIN: 14.3 g/dL (ref 12.0–15.0)
Lymphocytes Relative: 37.5 % (ref 12.0–46.0)
Lymphs Abs: 2.1 10*3/uL (ref 0.7–4.0)
MCHC: 33.6 g/dL (ref 30.0–36.0)
MCV: 88.8 fl (ref 78.0–100.0)
MONOS PCT: 6.8 % (ref 3.0–12.0)
Monocytes Absolute: 0.4 10*3/uL (ref 0.1–1.0)
NEUTROS ABS: 2.9 10*3/uL (ref 1.4–7.7)
Neutrophils Relative %: 50.9 % (ref 43.0–77.0)
Platelets: 268 10*3/uL (ref 150.0–400.0)
RBC: 4.78 Mil/uL (ref 3.87–5.11)
RDW: 13.8 % (ref 11.5–15.5)
WBC: 5.7 10*3/uL (ref 4.0–10.5)

## 2014-05-13 LAB — LIPID PANEL
CHOLESTEROL: 288 mg/dL — AB (ref 0–200)
HDL: 36.5 mg/dL — AB (ref >39.00)
LDL Cholesterol: 223 mg/dL — ABNORMAL HIGH (ref 0–99)
NonHDL: 251.5
TRIGLYCERIDES: 143 mg/dL (ref 0.0–149.0)
Total CHOL/HDL Ratio: 8
VLDL: 28.6 mg/dL (ref 0.0–40.0)

## 2014-05-13 LAB — COMPREHENSIVE METABOLIC PANEL
ALT: 19 U/L (ref 0–35)
AST: 26 U/L (ref 0–37)
Albumin: 3.3 g/dL — ABNORMAL LOW (ref 3.5–5.2)
Alkaline Phosphatase: 72 U/L (ref 39–117)
BILIRUBIN TOTAL: 0.6 mg/dL (ref 0.2–1.2)
BUN: 18 mg/dL (ref 6–23)
CHLORIDE: 108 meq/L (ref 96–112)
CO2: 25 meq/L (ref 19–32)
CREATININE: 1.1 mg/dL (ref 0.4–1.2)
Calcium: 9.3 mg/dL (ref 8.4–10.5)
GFR: 50.48 mL/min — AB (ref >60.00)
GLUCOSE: 81 mg/dL (ref 70–99)
Potassium: 4 mEq/L (ref 3.5–5.1)
Sodium: 141 mEq/L (ref 135–145)
Total Protein: 6.7 g/dL (ref 6.0–8.3)

## 2014-05-13 LAB — TSH: TSH: 1.33 u[IU]/mL (ref 0.35–4.50)

## 2014-05-13 MED ORDER — ATORVASTATIN CALCIUM 20 MG PO TABS: 20.0000 mg | ORAL_TABLET | Freq: Every day | ORAL | Status: DC

## 2014-05-13 NOTE — Progress Notes (Signed)
Pre visit review using our clinic review tool, if applicable. No additional management support is needed unless otherwise documented below in the visit note. 

## 2014-05-13 NOTE — Patient Instructions (Signed)
Limit your sodium (Salt) intake    It is important that you exercise regularly, at least 20 minutes 3 to 4 times per week.  If you develop chest pain or shortness of breath seek  medical attention.  Take a calcium supplement, plus [phone] units of vitamin D  Health Maintenance Adopting a healthy lifestyle and getting preventive care can go a long way to promote health and wellness. Talk with your health care provider about what schedule of regular examinations is right for you. This is a good chance for you to check in with your provider about disease prevention and staying healthy. In between checkups, there are plenty of things you can do on your own. Experts have done a lot of research about which lifestyle changes and preventive measures are most likely to keep you healthy. Ask your health care provider for more information. WEIGHT AND DIET  Eat a healthy diet  Be sure to include plenty of vegetables, fruits, low-fat dairy products, and lean protein.  Do not eat a lot of foods high in solid fats, added sugars, or salt.  Get regular exercise. This is one of the most important things you can do for your health.  Most adults should exercise for at least 150 minutes each week. The exercise should increase your heart rate and make you sweat (moderate-intensity exercise).  Most adults should also do strengthening exercises at least twice a week. This is in addition to the moderate-intensity exercise.  Maintain a healthy weight  Body mass index (BMI) is a measurement that can be used to identify possible weight problems. It estimates body fat based on height and weight. Your health care provider can help determine your BMI and help you achieve or maintain a healthy weight.  For females 28 years of age and older:   A BMI below 18.5 is considered underweight.  A BMI of 18.5 to 24.9 is normal.  A BMI of 25 to 29.9 is considered overweight.  A BMI of 30 and above is considered obese.   Watch levels of cholesterol and blood lipids  You should start having your blood tested for lipids and cholesterol at 74 years of age, then have this test every 5 years.  You may need to have your cholesterol levels checked more often if:  Your lipid or cholesterol levels are high.  You are older than 74 years of age.  You are at high risk for heart disease.  CANCER SCREENING   Lung Cancer  Lung cancer screening is recommended for adults 50-90 years old who are at high risk for lung cancer because of a history of smoking.  A yearly low-dose CT scan of the lungs is recommended for people who:  Currently smoke.  Have quit within the past 15 years.  Have at least a 30-pack-year history of smoking. A pack year is smoking an average of one pack of cigarettes a day for 1 year.  Yearly screening should continue until it has been 15 years since you quit.  Yearly screening should stop if you develop a health problem that would prevent you from having lung cancer treatment.  Breast Cancer  Practice breast self-awareness. This means understanding how your breasts normally appear and feel.  It also means doing regular breast self-exams. Let your health care provider know about any changes, no matter how small.  If you are in your 20s or 30s, you should have a clinical breast exam (CBE) by a health care provider every 1-3 years  as part of a regular health exam.  If you are 32 or older, have a CBE every year. Also consider having a breast X-ray (mammogram) every year.  If you have a family history of breast cancer, talk to your health care provider about genetic screening.  If you are at high risk for breast cancer, talk to your health care provider about having an MRI and a mammogram every year.  Breast cancer gene (BRCA) assessment is recommended for women who have family members with BRCA-related cancers. BRCA-related cancers  include:  Breast.  Ovarian.  Tubal.  Peritoneal cancers.  Results of the assessment will determine the need for genetic counseling and BRCA1 and BRCA2 testing. Cervical Cancer Routine pelvic examinations to screen for cervical cancer are no longer recommended for nonpregnant women who are considered low risk for cancer of the pelvic organs (ovaries, uterus, and vagina) and who do not have symptoms. A pelvic examination may be necessary if you have symptoms including those associated with pelvic infections. Ask your health care provider if a screening pelvic exam is right for you.   The Pap test is the screening test for cervical cancer for women who are considered at risk.  If you had a hysterectomy for a problem that was not cancer or a condition that could lead to cancer, then you no longer need Pap tests.  If you are older than 65 years, and you have had normal Pap tests for the past 10 years, you no longer need to have Pap tests.  If you have had past treatment for cervical cancer or a condition that could lead to cancer, you need Pap tests and screening for cancer for at least 20 years after your treatment.  If you no longer get a Pap test, assess your risk factors if they change (such as having a new sexual partner). This can affect whether you should start being screened again.  Some women have medical problems that increase their chance of getting cervical cancer. If this is the case for you, your health care provider may recommend more frequent screening and Pap tests.  The human papillomavirus (HPV) test is another test that may be used for cervical cancer screening. The HPV test looks for the virus that can cause cell changes in the cervix. The cells collected during the Pap test can be tested for HPV.  The HPV test can be used to screen women 28 years of age and older. Getting tested for HPV can extend the interval between normal Pap tests from three to five years.  An HPV  test also should be used to screen women of any age who have unclear Pap test results.  After 74 years of age, women should have HPV testing as often as Pap tests.  Colorectal Cancer  This type of cancer can be detected and often prevented.  Routine colorectal cancer screening usually begins at 74 years of age and continues through 74 years of age.  Your health care provider may recommend screening at an earlier age if you have risk factors for colon cancer.  Your health care provider may also recommend using home test kits to check for hidden blood in the stool.  A small camera at the end of a tube can be used to examine your colon directly (sigmoidoscopy or colonoscopy). This is done to check for the earliest forms of colorectal cancer.  Routine screening usually begins at age 60.  Direct examination of the colon should be repeated every  5-10 years through 74 years of age. However, you may need to be screened more often if early forms of precancerous polyps or small growths are found. Skin Cancer  Check your skin from head to toe regularly.  Tell your health care provider about any new moles or changes in moles, especially if there is a change in a mole's shape or color.  Also tell your health care provider if you have a mole that is larger than the size of a pencil eraser.  Always use sunscreen. Apply sunscreen liberally and repeatedly throughout the day.  Protect yourself by wearing long sleeves, pants, a wide-brimmed hat, and sunglasses whenever you are outside. HEART DISEASE, DIABETES, AND HIGH BLOOD PRESSURE   Have your blood pressure checked at least every 1-2 years. High blood pressure causes heart disease and increases the risk of stroke.  If you are between 2 years and 56 years old, ask your health care provider if you should take aspirin to prevent strokes.  Have regular diabetes screenings. This involves taking a blood sample to check your fasting blood sugar  level.  If you are at a normal weight and have a low risk for diabetes, have this test once every three years after 74 years of age.  If you are overweight and have a high risk for diabetes, consider being tested at a younger age or more often. PREVENTING INFECTION  Hepatitis B  If you have a higher risk for hepatitis B, you should be screened for this virus. You are considered at high risk for hepatitis B if:  You were born in a country where hepatitis B is common. Ask your health care provider which countries are considered high risk.  Your parents were born in a high-risk country, and you have not been immunized against hepatitis B (hepatitis B vaccine).  You have HIV or AIDS.  You use needles to inject street drugs.  You live with someone who has hepatitis B.  You have had sex with someone who has hepatitis B.  You get hemodialysis treatment.  You take certain medicines for conditions, including cancer, organ transplantation, and autoimmune conditions. Hepatitis C  Blood testing is recommended for:  Everyone born from [birth year] through 1966.  Anyone with known risk factors for hepatitis C. Sexually transmitted infections (STIs)  You should be screened for sexually transmitted infections (STIs) including gonorrhea and chlamydia if:  You are sexually active and are younger than 74 years of age.  You are older than 74 years of age and your health care provider tells you that you are at risk for this type of infection.  Your sexual activity has changed since you were last screened and you are at an increased risk for chlamydia or gonorrhea. Ask your health care provider if you are at risk.  If you do not have HIV, but are at risk, it may be recommended that you take a prescription medicine daily to prevent HIV infection. This is called pre-exposure prophylaxis (PrEP). You are considered at risk if:  You are sexually active and do not regularly use condoms or know the HIV status  of your partner(s).  You take drugs by injection.  You are sexually active with a partner who has HIV. Talk with your health care provider about whether you are at high risk of being infected with HIV. If you choose to begin PrEP, you should first be tested for HIV. You should then be tested every 3 months for as long as you  are taking PrEP.  PREGNANCY   If you are premenopausal and you may become pregnant, ask your health care provider about preconception counseling.  If you may become pregnant, take 400 to 800 micrograms (mcg) of folic acid every day.  If you want to prevent pregnancy, talk to your health care provider about birth control (contraception). OSTEOPOROSIS AND MENOPAUSE   Osteoporosis is a disease in which the bones lose minerals and strength with aging. This can result in serious bone fractures. Your risk for osteoporosis can be identified using a bone density scan.  If you are 74 years of age or older, or if you are at risk for osteoporosis and fractures, ask your health care provider if you should be screened.  Ask your health care provider whether you should take a calcium or vitamin D supplement to lower your risk for osteoporosis.  Menopause may have certain physical symptoms and risks.  Hormone replacement therapy may reduce some of these symptoms and risks. Talk to your health care provider about whether hormone replacement therapy is right for you.  HOME CARE INSTRUCTIONS   Schedule regular health, dental, and eye exams.  Stay current with your immunizations.   Do not use any tobacco products including cigarettes, chewing tobacco, or electronic cigarettes.  If you are pregnant, do not drink alcohol.  If you are breastfeeding, limit how much and how often you drink alcohol.  Limit alcohol intake to no more than 1 drink per day for nonpregnant women. One drink equals 12 ounces of beer, 5 ounces of wine, or 1 ounces of hard liquor.  Do not use street  drugs.  Do not share needles.  Ask your health care provider for help if you need support or information about quitting drugs.  Tell your health care provider if you often feel depressed.  Tell your health care provider if you have ever been abused or do not feel safe at home. Document Released: 01/08/2011 Document Revised: 11/09/2013 Document Reviewed: 05/27/2013 [facility] Patient Information 2015 [place], [place]. This information is not intended to replace advice given to you by your health care provider. Make sure you discuss any questions you have with your health care provider.

## 2014-05-13 NOTE — Progress Notes (Signed)
Patient ID: Tina Smith, female   DOB: Aug 17, 1940, 73 y.o.   MRN: [id number]  Subjective:    Patient ID: Tina Smith, female    DOB: 02-22-1941, 73 y.o.   MRN: [id number]  HPI 73  year-old patient who is in today for followup and an annual health examination.  She has a history of osteoarthritis as well as dyslipidemia. She discontinued atorvastatin in the past due to chronic fatigue.  The fatigue has been present for greater than 30 years and she states it is worse perhaps for  about 10 years ago. Approximately 30 years ago she was seen by psychiatry for possible depression and was treated with Prozac. She states that it was felt that she did not have depression. She has felt no improvement since discontinuation of statin therapy. She's also stopped calcium and vitamin D supplementation. She has been seen by gynecology for a cystocele and was concerned about calcium aggravating constipation issues. Her last colonoscopy 2013.  Here for Medicare AWV:   1. Risk factors based on Past M, S, F history: risk factors include hypercholesterolemia  2. Physical Activities: remains quite active. Does have some left knee discomfort; plan on joining [employer]  3. Depression/mood: no history of depression or mood disorder;  does complain of chronic fatigue and greater than 30 years duration 4. Hearing: uses hearing aid on the right  5. ADL's: independent in all aspects of daily living  6. Fall Risk: low  7. Home Safety: no problems identified  8. Height, weight, &visual acuity:height and weight stable. No difficulty with visual acuity  9. Counseling: exercise heart healthy diet. Encouraged  10. Labs ordered based on risk factors: will check a lipid profile next visit  11. Referral Coordination none appropriate at this time 12. Care Plan- continue heart healthy diet.  Will check lipid profile and consider resuming statin therapy 13. Cognitive Assessment- alert and oriented normal affect. No cognitive  dysfunction  14.  Preventive services will include annual health assessment exam with screening lab and mammogram.  She does have periodic gynecologic evaluations.  Recent follow-up colonoscopy 2013 Patient was provided with a written and personalized care plan 15. Provider list includes primary care.  GI radiology and gynecology  Preventive Screening-Counseling & Management  Alcohol-Tobacco  Smoking Status: never   Allergies (verified):  No Known Drug Allergies   Past History:  Past Medical History:  Hyperlipidemia  partial deafness  history of rheumatic fever  Osteoarthritis  Colonic polyps History of mild OSA-does have a CPAP machine  Past Surgical History:  Tonsillectomy 1944  breast biopsy 1980  colonoscopy 2001 2013 Cystocele  Family History:   mother died recently at age 34 history of Crohn's disease and valvular heart disease  father is still living at 75 one brother one sister both in good health Mother died at 63   Social History:   Widow/Widower  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 HYPERLIPIDEMIA 07/05/2010  . OSTEOARTHRITIS 07/05/2010  . Deafness     partial  . Rheumatic fever     hx  . Sleep apnea     does not tolerate CPAP    History   Social History  . Marital Status: Married    Spouse Name: N/A    Number of Children: N/A  . Years  of Education: N/A   Occupational History  . Not on file.   Social History Main Topics  . Smoking status: Never Smoker   . Smokeless tobacco: Never Used  . Alcohol Use: Yes     Comment: occasional  . Drug Use: No  . Sexual Activity: Not on file   Other Topics Concern  . Not on file   Social History Narrative    Past Surgical History  Procedure Laterality Date  . Tonsillectomy  1944  . Breast surgery  1960     bx  . Colonoscopy  2003, 2013    2003 - [place], int hemorrhoids, 2013 -     Family History  Problem Relation Age of Onset  . Heart disease Mother   . Colitis Mother   . Colon cancer Neg Hx   . Stomach cancer Neg Hx     No Known Allergies  Current Outpatient Prescriptions on File Prior to Visit  Medication Sig Dispense Refill  . aspirin 81 MG tablet Take 81 mg by mouth daily.      . Cholecalciferol (VITAMIN D) 1000 UNITS capsule Take 2,000 Units by mouth daily.     . Glucosamine 500 MG TABS Take by mouth daily.      . Multiple Minerals-Vitamins (CALCIUM CITRATE PLUS/MAGNESIUM PO) Take 2 tablets by mouth 2 (two) times daily.    . Multiple Vitamin (MULTIVITAMIN) tablet Take 1 tablet by mouth daily.       No current facility-administered medications on file prior to visit.    BP 110/70 mmHg  Pulse 81  Temp(Src) 98 F (36.7 C) (Oral)  Resp 20  Ht 5\' 5"  (1.651 m)  Wt 140 lb (63.504 kg)  BMI 23.30 kg/m2  SpO2 97%       Review of Systems  Constitutional: Positive for fatigue. Negative for fever, appetite change and unexpected weight change.  HENT: Negative for congestion, dental problem, ear pain, hearing loss, mouth sores, nosebleeds, rhinorrhea, sinus pressure, sore throat, tinnitus, trouble swallowing and voice change.   Eyes: Negative for photophobia, pain, discharge, redness and visual disturbance.  Respiratory: Negative for cough, chest tightness and shortness of breath.   Cardiovascular: Negative for chest pain, palpitations and leg swelling.  Gastrointestinal: Negative for nausea, vomiting, abdominal pain, diarrhea, constipation, blood in stool, abdominal distention and rectal pain.  Genitourinary: Negative for dysuria, urgency, frequency, hematuria, flank pain, vaginal bleeding, vaginal discharge, difficulty urinating, genital sores, vaginal pain, menstrual problem and pelvic pain.  Musculoskeletal: Negative for back pain, joint swelling, arthralgias, gait problem  and neck stiffness.  Skin: Negative for rash.  Neurological: Positive for weakness. Negative for dizziness, syncope, speech difficulty, light-headedness, numbness and headaches.  Hematological: Negative for adenopathy. Does not bruise/bleed easily.  Psychiatric/Behavioral: Negative for suicidal ideas, behavioral problems, self-injury, dysphoric mood and agitation. The patient is not nervous/anxious.        Objective:   Physical Exam  Constitutional: She is oriented to person, place, and time. She appears well-developed and well-nourished.  HENT:  Head: Normocephalic and atraumatic.  Right Ear: External ear normal.  Left Ear: External ear normal.  Mouth/Throat: Oropharynx is clear and moist.  Eyes: Conjunctivae and EOM are normal.  Neck: Normal range of motion. Neck supple. No JVD present. No thyromegaly present.  Cardiovascular: Normal rate, regular rhythm, normal heart sounds and intact distal pulses.   No murmur heard. Pulmonary/Chest: Effort normal and breath sounds normal. She has no wheezes. She has no rales.  Abdominal: Soft. Bowel sounds are normal.  She exhibits no distension and no mass. There is no tenderness. There is no rebound and no guarding.  Musculoskeletal: Normal range of motion. She exhibits no edema or tenderness.  Neurological: She is alert and oriented to person, place, and time. She has normal reflexes. No cranial nerve deficit. She exhibits normal muscle tone. Coordination normal.  Skin: Skin is warm and dry. No rash noted.  Psychiatric: She has a normal mood and affect. Her behavior is normal.          Assessment & Plan:  Dyslipidemia. We'll check a fasting lipid profile Osteoarthritis. Clinically stable  Preventive health exam3.  Will review screening lab Osteopenia.  Continue calcium and vitamin D supplementation  We'll continue exercise calcium and vitamin D supplementation laboratory studies will be checked. We'll see in one year for a complete  exam

## 2014-05-15 ENCOUNTER — Other Ambulatory Visit: Payer: Self-pay | Admitting: Internal Medicine

## 2014-05-16 ENCOUNTER — Encounter: Payer: Self-pay | Admitting: Internal Medicine

## 2014-05-17 ENCOUNTER — Other Ambulatory Visit: Payer: Self-pay | Admitting: *Deleted

## 2014-05-17 MED ORDER — ATORVASTATIN CALCIUM 40 MG PO TABS: 20.0000 mg | ORAL_TABLET | Freq: Every day | ORAL | Status: DC

## 2014-05-17 NOTE — Telephone Encounter (Signed)
Spoke to pt, told her Rx for Atorvastatin 40 mg was sent to pharmacy, take half tablet daily. Pt verbalized understanding.

## 2014-09-22 ENCOUNTER — Encounter: Payer: Self-pay | Admitting: Internal Medicine

## 2014-10-26 ENCOUNTER — Other Ambulatory Visit: Payer: Self-pay | Admitting: Internal Medicine

## 2014-10-26 ENCOUNTER — Other Ambulatory Visit: Payer: Self-pay

## 2014-10-26 DIAGNOSIS — R928 Other abnormal and inconclusive findings on diagnostic imaging of breast: Secondary | ICD-10-CM

## 2014-10-28 ENCOUNTER — Ambulatory Visit
Admission: RE | Admit: 2014-10-28 | Discharge: 2014-10-28 | Disposition: A | Discharge status: Home / Self Care | Payer: Medicare Other | Source: Ambulatory Visit | Attending: Internal Medicine | Admitting: Internal Medicine

## 2014-10-28 DIAGNOSIS — R928 Other abnormal and inconclusive findings on diagnostic imaging of breast: Secondary | ICD-10-CM

## 2014-10-28 DIAGNOSIS — R922 Inconclusive mammogram: Secondary | ICD-10-CM | POA: Diagnosis not present

## 2014-11-23 ENCOUNTER — Ambulatory Visit: Payer: Medicare Other | Admitting: Internal Medicine

## 2014-11-25 ENCOUNTER — Encounter: Payer: Self-pay | Admitting: Internal Medicine

## 2014-11-25 ENCOUNTER — Ambulatory Visit (INDEPENDENT_AMBULATORY_CARE_PROVIDER_SITE_OTHER): Payer: Medicare Other | Admitting: Internal Medicine

## 2014-11-25 VITALS — BP 114/70 | HR 68 | Ht 64.25 in | Wt 139.5 lb

## 2014-11-25 DIAGNOSIS — K529 Noninfective gastroenteritis and colitis, unspecified: Secondary | ICD-10-CM

## 2014-11-25 NOTE — Progress Notes (Signed)
   Subjective:    Patient ID: Tina Smith, female    DOB: 14-Jun-1941, 74 y.o.   MRN: [id number] Cc: diarrhea HPI This very nice elderly ww is known to me from prior colonoscopy that found polyps. Over the past year or more she has suffered with what has become daily diarrhea problems. She says AM is worst, mostly a post prandial urgent and loose to watery defecation. She has eliminated certain foods without much benefit, does think very rich and high fat foods are worst offenders. No weight loss, no bleeding. She has had some urge incontinence. She says "I am like the Humira ad", also afraid to eat out.  No abdominal pain. Loperamide does provide some relief. Occasional nocturnal sxs.  Sugar-free and lactose do not seem to be issues here.  These sxs are new in past year.  Her mother had a colectomy for UC in her 77's and lived into 62's  Medications, allergies, past medical history, past surgical history, family history and social history are reviewed and updated in the EMR.    Review of Systems Decreased hearing, joint pains, fatigue are + All other ROS negative or as per HPI    Objective:   Physical Exam @BP  114/70 mmHg  Pulse 68  Ht 5' 4.25" (1.632 m)  Wt 139 lb 8 oz (63.277 kg)  BMI 23.76 kg/m2@  General:  Well-developed, well-nourished and in no acute distress Eyes:  anicteric..  Lungs: Clear to auscultation bilaterally. Heart:  S1S2, no rubs, murmurs, gallops. Abdomen:  soft, non-tender, no hepatosplenomegaly, hernia, or mass and BS+.  Rectal: Deferred until colonoscopy Extremities:   no edema, cyanosis or clubbing Neuro:  A&O x 3.  Psych:  appropriate mood and  Affect.   Data Reviewed:  Last colonoscopy 2013 Lab Results  Component Value Date   TSH 1.33 05/13/2014   Lab Results  Component Value Date   WBC 5.7 05/13/2014   HGB 14.3 05/13/2014   HCT 42.5 05/13/2014   MCV 88.8 05/13/2014   PLT 268.0 05/13/2014     Chemistry      Component Value Date/Time     NA 141 05/13/2014 1141   K 4.0 05/13/2014 1141   CL 108 05/13/2014 1141   CO2 25 05/13/2014 1141   BUN 18 05/13/2014 1141   CREATININE 1.1 05/13/2014 1141      Component Value Date/Time   CALCIUM 9.3 05/13/2014 1141   ALKPHOS 72 05/13/2014 1141   AST 26 05/13/2014 1141   ALT 19 05/13/2014 1141   BILITOT 0.6 05/13/2014 1141        Assessment & Plan:  Chronic diarrhea  X 1 year - FHx UC in mother Colonoscopy 2013 - small adenoma Given chronicity and symptom pattern I have a recommended a colonoscopy with biopsies and uleoscopy ? IBd, ? Microscopic colitis No recent Abx (at all), no new meds, no weight loss (? IBS), TSH ok  25 minutes time spent with patient > half in counseling coordination of care

## 2014-11-25 NOTE — Patient Instructions (Signed)
You have been scheduled for a colonoscopy. Please follow written instructions given to you at your visit today.  Please pick up your prep supplies at the pharmacy within the next 1-3 days. If you use inhalers (even only as needed), please bring them with you on the day of your procedure.   I appreciate the opportunity to care for you. Carl Gessner, MD, FACG 

## 2014-11-27 ENCOUNTER — Encounter: Payer: Self-pay | Admitting: Internal Medicine

## 2014-12-02 ENCOUNTER — Ambulatory Visit (AMBULATORY_SURGERY_CENTER): Payer: Medicare Other | Admitting: Internal Medicine

## 2014-12-02 ENCOUNTER — Encounter: Payer: Self-pay | Admitting: Internal Medicine

## 2014-12-02 VITALS — BP 102/46 | HR 60 | Temp 97.2°F | Resp 16 | Ht 64.0 in | Wt 139.0 lb

## 2014-12-02 DIAGNOSIS — R197 Diarrhea, unspecified: Secondary | ICD-10-CM | POA: Diagnosis not present

## 2014-12-02 DIAGNOSIS — K529 Noninfective gastroenteritis and colitis, unspecified: Secondary | ICD-10-CM | POA: Diagnosis not present

## 2014-12-02 DIAGNOSIS — G4733 Obstructive sleep apnea (adult) (pediatric): Secondary | ICD-10-CM | POA: Diagnosis not present

## 2014-12-02 LAB — HM COLONOSCOPY

## 2014-12-02 MED ORDER — SODIUM CHLORIDE 0.9 % IV SOLN: 500.0000 mL | INTRAVENOUS | Status: DC

## 2014-12-02 NOTE — Patient Instructions (Addendum)
The colon lining looks normal - no colitis seen. I took biopsies like I explained and should have those results next week and will call. It is ok to use Imodium AD (loperamide) to treat diarrhea for now. Avoid eating a large amount of fiber for now also - it will increase stools.  You also have a condition called diverticulosis - common and not usually a problem. Please read the handout provided.  I appreciate the opportunity to care for you. Gatha Mayer, MD, FACG  YOU HAD AN ENDOSCOPIC PROCEDURE TODAY AT [facility]:   Refer to the procedure report that was given to you for any specific questions about what was found during the examination.  If the procedure report does not answer your questions, please call your gastroenterologist to clarify.  If you requested that your care partner not be given the details of your procedure findings, then the procedure report has been included in a sealed envelope for you to review at your convenience later.  YOU SHOULD EXPECT: Some feelings of bloating in the abdomen. Passage of more gas than usual.  Walking can help get rid of the air that was put into your GI tract during the procedure and reduce the bloating. If you had a lower endoscopy (such as a colonoscopy or flexible sigmoidoscopy) you may notice spotting of blood in your stool or on the toilet paper. If you underwent a bowel prep for your procedure, you may not have a normal bowel movement for a few days.  Please Note:  You might notice some irritation and congestion in your nose or some drainage.  This is from the oxygen used during your procedure.  There is no need for concern and it should clear up in a day or so.  SYMPTOMS TO REPORT IMMEDIATELY:   Following lower endoscopy (colonoscopy or flexible sigmoidoscopy):  Excessive amounts of blood in the stool  Significant tenderness or worsening of abdominal pains  Swelling of the abdomen that is new, acute  Fever of  100F or higher  For urgent or emergent issues, a gastroenterologist can be reached at any hour by calling [phone].   DIET: Your first meal following the procedure should be a small meal and then it is ok to progress to your normal diet. Heavy or fried foods are harder to digest and may make you feel nauseous or bloated.  Likewise, meals heavy in dairy and vegetables can increase bloating.  Drink plenty of fluids but you should avoid alcoholic beverages for 24 hours.  ACTIVITY:  You should plan to take it easy for the rest of today and you should NOT DRIVE or use heavy machinery until tomorrow (because of the sedation medicines used during the test).    FOLLOW UP: Our staff will call the number listed on your records the next business day following your procedure to check on you and address any questions or concerns that you may have regarding the information given to you following your procedure. If we do not reach you, we will leave a message.  However, if you are feeling well and you are not experiencing any problems, there is no need to return our call.  We will assume that you have returned to your regular daily activities without incident.  If any biopsies were taken you will be contacted by phone or by letter within the next 1-3 weeks.  Please call us at [phone] if you have not heard about the biopsies  in 3 weeks.    SIGNATURES/CONFIDENTIALITY: You and/or your care partner have signed paperwork which will be entered into your electronic medical record.  These signatures attest to the fact that that the information above on your After Visit Summary has been reviewed and is understood.  Full responsibility of the confidentiality of this discharge information lies with you and/or your care-partner.

## 2014-12-02 NOTE — Progress Notes (Signed)
Report to PACU, RN, vss, BBS= Clear.  

## 2014-12-02 NOTE — Progress Notes (Signed)
Called to room to assist during endoscopic procedure.  Patient ID and intended procedure confirmed with present staff. Received instructions for my participation in the procedure from the performing physician.  

## 2014-12-02 NOTE — Op Note (Signed)
[place]  [address] [place], [id number]   COLONOSCOPY PROCEDURE REPORT  PATIENT: Tina Smith, Tina Smith  MR#: [id number] BIRTHDATE: 07/03/1941 , 73  yrs. old GENDER: female ENDOSCOPIST: Gatha Mayer, MD, [facility] PROCEDURE DATE:  12/02/2014 PROCEDURE:   Colonoscopy with biopsy and Colonoscopy, diagnostic First Screening Colonoscopy - Avg.  risk and is 50 yrs.  old or older - No.  Prior Negative Screening - Now for repeat screening. N/A  History of Adenoma - Now for follow-up colonoscopy & has been > or = to 3 yrs.  N/A  Polyps removed today? No Recommend repeat exam, <10 yrs? No ASA CLASS:   Class II INDICATIONS:Clinically significant diarrhea of unexplained origin and Patient is not applicable for Colorectal Neoplasm Risk Assessment for this procedure. MEDICATIONS: Propofol 140 mg IV and Monitored anesthesia care  DESCRIPTION OF PROCEDURE:   After the risks benefits and alternatives of the procedure were thoroughly explained, informed consent was obtained.  The digital rectal exam revealed no abnormalities of the rectum.   The LB [id number]  endoscope was introduced through the anus and advanced to the terminal ileum which was intubated for a short distance. No adverse events experienced.   The quality of the prep was good.  (MiraLax was used)  The instrument was then slowly withdrawn as the colon was fully examined. Estimated blood loss is zero unless otherwise noted in this procedure report.      COLON FINDINGS: The examined terminal ileum appeared to be normal. The colonic mucosa appeared normal throughout the entire examined colon.  Multiple random biopsies were performed using cold forceps. Samples were sent to R/O microscopic colitis.   There was diverticulosis noted throughout the entire examined colon. Retroflexed views revealed no abnormalities. The time to cecum = 5.4 Withdrawal time = 7.1   The scope was withdrawn and the procedure  completed. COMPLICATIONS: There were no immediate complications.  ENDOSCOPIC IMPRESSION: 1.   The examined terminal ileum appeared to be normal 2.   The colonic mucosa appeared normal throughout the entire examined colon; multiple random biopsies were performed using cold forceps 3.   Diverticulosis was noted throughout the entire examined colon  RECOMMENDATIONS: 1.  Office will call with the results. 2.  Loperamide prn  eSigned:  Gatha Mayer, MD, [facility] 12/02/2014 7:59 AM   cc: The Patient and Bluford Kaufmann, MD

## 2014-12-03 ENCOUNTER — Telehealth: Payer: Self-pay | Admitting: *Deleted

## 2014-12-03 NOTE — Telephone Encounter (Signed)
Number identifier, left message, follow-up  

## 2014-12-14 DIAGNOSIS — H2513 Age-related nuclear cataract, bilateral: Secondary | ICD-10-CM | POA: Diagnosis not present

## 2014-12-14 DIAGNOSIS — H52203 Unspecified astigmatism, bilateral: Secondary | ICD-10-CM | POA: Diagnosis not present

## 2014-12-14 NOTE — Progress Notes (Signed)
Quick Note:  No colitis - likely dx is IBS Continue loperamide prn if that is working If not adequate let me know  [place] no letter and no recall ______

## 2015-04-11 ENCOUNTER — Encounter: Payer: Self-pay | Admitting: Internal Medicine

## 2015-04-14 ENCOUNTER — Ambulatory Visit (INDEPENDENT_AMBULATORY_CARE_PROVIDER_SITE_OTHER): Payer: Medicare Other

## 2015-04-14 DIAGNOSIS — Z23 Encounter for immunization: Secondary | ICD-10-CM

## 2015-05-17 ENCOUNTER — Encounter: Payer: Medicare Other | Admitting: Internal Medicine

## 2015-06-03 ENCOUNTER — Encounter: Payer: Medicare Other | Admitting: Internal Medicine

## 2015-06-23 ENCOUNTER — Encounter: Payer: Medicare Other | Admitting: Internal Medicine

## 2015-06-23 DIAGNOSIS — Z0000 Encounter for general adult medical examination without abnormal findings: Secondary | ICD-10-CM | POA: Diagnosis not present

## 2015-06-23 DIAGNOSIS — E785 Hyperlipidemia, unspecified: Secondary | ICD-10-CM | POA: Diagnosis not present

## 2015-07-07 ENCOUNTER — Encounter: Payer: Self-pay | Admitting: *Deleted

## 2015-08-01 DIAGNOSIS — N952 Postmenopausal atrophic vaginitis: Secondary | ICD-10-CM | POA: Diagnosis not present

## 2015-08-01 DIAGNOSIS — Z780 Asymptomatic menopausal state: Secondary | ICD-10-CM | POA: Diagnosis not present

## 2015-08-01 DIAGNOSIS — N814 Uterovaginal prolapse, unspecified: Secondary | ICD-10-CM | POA: Diagnosis not present

## 2015-08-01 DIAGNOSIS — M8589 Other specified disorders of bone density and structure, multiple sites: Secondary | ICD-10-CM | POA: Diagnosis not present

## 2015-08-01 DIAGNOSIS — N8110 Cystocele, unspecified: Secondary | ICD-10-CM | POA: Diagnosis not present

## 2015-08-09 DIAGNOSIS — N814 Uterovaginal prolapse, unspecified: Secondary | ICD-10-CM | POA: Diagnosis not present

## 2015-08-09 DIAGNOSIS — M810 Age-related osteoporosis without current pathological fracture: Secondary | ICD-10-CM | POA: Diagnosis not present

## 2015-08-09 DIAGNOSIS — N8110 Cystocele, unspecified: Secondary | ICD-10-CM | POA: Diagnosis not present

## 2015-08-09 DIAGNOSIS — N952 Postmenopausal atrophic vaginitis: Secondary | ICD-10-CM | POA: Diagnosis not present

## 2015-08-10 ENCOUNTER — Encounter: Payer: Self-pay | Admitting: Internal Medicine

## 2015-08-24 ENCOUNTER — Encounter: Payer: Self-pay | Admitting: Internal Medicine

## 2015-09-06 DIAGNOSIS — N8110 Cystocele, unspecified: Secondary | ICD-10-CM | POA: Diagnosis not present

## 2015-09-06 DIAGNOSIS — N814 Uterovaginal prolapse, unspecified: Secondary | ICD-10-CM | POA: Diagnosis not present

## 2015-09-06 DIAGNOSIS — N952 Postmenopausal atrophic vaginitis: Secondary | ICD-10-CM | POA: Diagnosis not present

## 2015-09-22 DIAGNOSIS — N814 Uterovaginal prolapse, unspecified: Secondary | ICD-10-CM | POA: Diagnosis not present

## 2015-09-22 DIAGNOSIS — N952 Postmenopausal atrophic vaginitis: Secondary | ICD-10-CM | POA: Diagnosis not present

## 2015-09-22 DIAGNOSIS — N8110 Cystocele, unspecified: Secondary | ICD-10-CM | POA: Diagnosis not present

## 2015-10-06 ENCOUNTER — Other Ambulatory Visit: Payer: Self-pay

## 2015-10-06 DIAGNOSIS — Z1231 Encounter for screening mammogram for malignant neoplasm of breast: Secondary | ICD-10-CM

## 2015-10-31 ENCOUNTER — Ambulatory Visit
Admission: RE | Admit: 2015-10-31 | Discharge: 2015-10-31 | Disposition: A | Discharge status: Home / Self Care | Payer: Medicare Other | Source: Ambulatory Visit

## 2015-10-31 DIAGNOSIS — Z1231 Encounter for screening mammogram for malignant neoplasm of breast: Secondary | ICD-10-CM | POA: Diagnosis not present

## 2015-12-28 DIAGNOSIS — N814 Uterovaginal prolapse, unspecified: Secondary | ICD-10-CM | POA: Diagnosis not present

## 2015-12-28 DIAGNOSIS — N8110 Cystocele, unspecified: Secondary | ICD-10-CM | POA: Diagnosis not present

## 2015-12-28 DIAGNOSIS — N952 Postmenopausal atrophic vaginitis: Secondary | ICD-10-CM | POA: Diagnosis not present

## 2016-04-13 DIAGNOSIS — Z23 Encounter for immunization: Secondary | ICD-10-CM | POA: Diagnosis not present

## 2016-06-28 DIAGNOSIS — N952 Postmenopausal atrophic vaginitis: Secondary | ICD-10-CM | POA: Diagnosis not present

## 2016-06-28 DIAGNOSIS — N8110 Cystocele, unspecified: Secondary | ICD-10-CM | POA: Diagnosis not present

## 2016-06-28 DIAGNOSIS — N814 Uterovaginal prolapse, unspecified: Secondary | ICD-10-CM | POA: Diagnosis not present

## 2016-06-29 DIAGNOSIS — Z5181 Encounter for therapeutic drug level monitoring: Secondary | ICD-10-CM | POA: Diagnosis not present

## 2016-06-29 DIAGNOSIS — R5382 Chronic fatigue, unspecified: Secondary | ICD-10-CM | POA: Diagnosis not present

## 2016-06-29 DIAGNOSIS — E785 Hyperlipidemia, unspecified: Secondary | ICD-10-CM | POA: Diagnosis not present

## 2016-06-29 DIAGNOSIS — N183 Chronic kidney disease, stage 3 (moderate): Secondary | ICD-10-CM | POA: Diagnosis not present

## 2016-06-29 DIAGNOSIS — Z0000 Encounter for general adult medical examination without abnormal findings: Secondary | ICD-10-CM | POA: Diagnosis not present

## 2016-06-29 DIAGNOSIS — E611 Iron deficiency: Secondary | ICD-10-CM | POA: Diagnosis not present

## 2016-08-23 ENCOUNTER — Other Ambulatory Visit: Payer: Self-pay | Admitting: Family Medicine

## 2016-08-23 DIAGNOSIS — Z1231 Encounter for screening mammogram for malignant neoplasm of breast: Secondary | ICD-10-CM

## 2016-11-01 ENCOUNTER — Ambulatory Visit
Admission: RE | Admit: 2016-11-01 | Discharge: 2016-11-01 | Disposition: A | Discharge status: Home / Self Care | Payer: Medicare Other | Source: Ambulatory Visit | Attending: Family Medicine | Admitting: Family Medicine

## 2016-11-01 DIAGNOSIS — Z1231 Encounter for screening mammogram for malignant neoplasm of breast: Secondary | ICD-10-CM | POA: Diagnosis not present

## 2016-12-13 DIAGNOSIS — N8110 Cystocele, unspecified: Secondary | ICD-10-CM | POA: Diagnosis not present

## 2016-12-13 DIAGNOSIS — N952 Postmenopausal atrophic vaginitis: Secondary | ICD-10-CM | POA: Diagnosis not present

## 2016-12-13 DIAGNOSIS — N814 Uterovaginal prolapse, unspecified: Secondary | ICD-10-CM | POA: Diagnosis not present

## 2016-12-27 DIAGNOSIS — Z1283 Encounter for screening for malignant neoplasm of skin: Secondary | ICD-10-CM | POA: Diagnosis not present

## 2016-12-27 DIAGNOSIS — L570 Actinic keratosis: Secondary | ICD-10-CM | POA: Diagnosis not present

## 2016-12-27 DIAGNOSIS — X32XXXA Exposure to sunlight, initial encounter: Secondary | ICD-10-CM | POA: Diagnosis not present

## 2017-01-21 DIAGNOSIS — H2513 Age-related nuclear cataract, bilateral: Secondary | ICD-10-CM | POA: Diagnosis not present

## 2017-01-21 DIAGNOSIS — H52203 Unspecified astigmatism, bilateral: Secondary | ICD-10-CM | POA: Diagnosis not present

## 2017-03-28 ENCOUNTER — Encounter: Payer: Self-pay | Admitting: Internal Medicine

## 2017-04-18 DIAGNOSIS — N814 Uterovaginal prolapse, unspecified: Secondary | ICD-10-CM | POA: Diagnosis not present

## 2017-04-18 DIAGNOSIS — N952 Postmenopausal atrophic vaginitis: Secondary | ICD-10-CM | POA: Diagnosis not present

## 2017-04-18 DIAGNOSIS — N8110 Cystocele, unspecified: Secondary | ICD-10-CM | POA: Diagnosis not present

## 2017-04-25 DIAGNOSIS — Z23 Encounter for immunization: Secondary | ICD-10-CM | POA: Diagnosis not present

## 2017-06-21 DIAGNOSIS — L600 Ingrowing nail: Secondary | ICD-10-CM | POA: Diagnosis not present

## 2017-07-08 DIAGNOSIS — Z0000 Encounter for general adult medical examination without abnormal findings: Secondary | ICD-10-CM | POA: Diagnosis not present

## 2017-07-08 DIAGNOSIS — Z136 Encounter for screening for cardiovascular disorders: Secondary | ICD-10-CM | POA: Diagnosis not present

## 2017-07-08 DIAGNOSIS — E785 Hyperlipidemia, unspecified: Secondary | ICD-10-CM | POA: Diagnosis not present

## 2017-07-08 DIAGNOSIS — R5382 Chronic fatigue, unspecified: Secondary | ICD-10-CM | POA: Diagnosis not present

## 2017-07-28 DIAGNOSIS — J018 Other acute sinusitis: Secondary | ICD-10-CM | POA: Diagnosis not present

## 2017-08-01 ENCOUNTER — Other Ambulatory Visit: Payer: Self-pay | Admitting: Obstetrics & Gynecology

## 2017-08-01 DIAGNOSIS — N6320 Unspecified lump in the left breast, unspecified quadrant: Secondary | ICD-10-CM | POA: Diagnosis not present

## 2017-08-01 DIAGNOSIS — N814 Uterovaginal prolapse, unspecified: Secondary | ICD-10-CM | POA: Diagnosis not present

## 2017-08-01 DIAGNOSIS — N6002 Solitary cyst of left breast: Secondary | ICD-10-CM

## 2017-08-01 DIAGNOSIS — N952 Postmenopausal atrophic vaginitis: Secondary | ICD-10-CM | POA: Diagnosis not present

## 2017-08-06 ENCOUNTER — Other Ambulatory Visit: Payer: Medicare Other

## 2017-08-07 ENCOUNTER — Ambulatory Visit
Admission: RE | Admit: 2017-08-07 | Discharge: 2017-08-07 | Disposition: A | Discharge status: Home / Self Care | Payer: Medicare Other | Source: Ambulatory Visit | Attending: Obstetrics & Gynecology | Admitting: Obstetrics & Gynecology

## 2017-08-07 ENCOUNTER — Other Ambulatory Visit: Payer: Self-pay | Admitting: Obstetrics & Gynecology

## 2017-08-07 DIAGNOSIS — N6320 Unspecified lump in the left breast, unspecified quadrant: Secondary | ICD-10-CM

## 2017-08-07 DIAGNOSIS — N6002 Solitary cyst of left breast: Secondary | ICD-10-CM

## 2017-08-07 DIAGNOSIS — R922 Inconclusive mammogram: Secondary | ICD-10-CM | POA: Diagnosis not present

## 2017-08-07 DIAGNOSIS — N644 Mastodynia: Secondary | ICD-10-CM | POA: Diagnosis not present

## 2017-10-17 DIAGNOSIS — N952 Postmenopausal atrophic vaginitis: Secondary | ICD-10-CM | POA: Diagnosis not present

## 2017-10-17 DIAGNOSIS — N814 Uterovaginal prolapse, unspecified: Secondary | ICD-10-CM | POA: Diagnosis not present

## 2017-10-17 DIAGNOSIS — N6320 Unspecified lump in the left breast, unspecified quadrant: Secondary | ICD-10-CM | POA: Diagnosis not present

## 2017-11-07 ENCOUNTER — Other Ambulatory Visit: Payer: Medicare Other

## 2018-02-04 DIAGNOSIS — H2513 Age-related nuclear cataract, bilateral: Secondary | ICD-10-CM | POA: Diagnosis not present

## 2018-02-04 DIAGNOSIS — H52203 Unspecified astigmatism, bilateral: Secondary | ICD-10-CM | POA: Diagnosis not present

## 2018-02-19 DIAGNOSIS — Z6821 Body mass index (BMI) 21.0-21.9, adult: Secondary | ICD-10-CM | POA: Diagnosis not present

## 2018-02-19 DIAGNOSIS — J069 Acute upper respiratory infection, unspecified: Secondary | ICD-10-CM | POA: Diagnosis not present

## 2018-02-19 DIAGNOSIS — R509 Fever, unspecified: Secondary | ICD-10-CM | POA: Diagnosis not present

## 2018-04-15 ENCOUNTER — Ambulatory Visit (INDEPENDENT_AMBULATORY_CARE_PROVIDER_SITE_OTHER): Payer: Medicare Other | Admitting: Family Medicine

## 2018-04-15 ENCOUNTER — Encounter: Payer: Self-pay | Admitting: Family Medicine

## 2018-04-15 VITALS — BP 118/66 | HR 70 | Temp 98.5°F | Ht 64.25 in | Wt 136.8 lb

## 2018-04-15 DIAGNOSIS — E538 Deficiency of other specified B group vitamins: Secondary | ICD-10-CM

## 2018-04-15 DIAGNOSIS — E7800 Pure hypercholesterolemia, unspecified: Secondary | ICD-10-CM | POA: Diagnosis not present

## 2018-04-15 DIAGNOSIS — R5382 Chronic fatigue, unspecified: Secondary | ICD-10-CM

## 2018-04-15 DIAGNOSIS — K5904 Chronic idiopathic constipation: Secondary | ICD-10-CM

## 2018-04-15 DIAGNOSIS — Z23 Encounter for immunization: Secondary | ICD-10-CM

## 2018-04-15 DIAGNOSIS — E785 Hyperlipidemia, unspecified: Secondary | ICD-10-CM | POA: Diagnosis not present

## 2018-04-15 LAB — CBC WITH DIFFERENTIAL/PLATELET
Basophils Absolute: 0.1 10*3/uL (ref 0.0–0.1)
Basophils Relative: 2 % (ref 0.0–3.0)
Eosinophils Absolute: 0.1 10*3/uL (ref 0.0–0.7)
Eosinophils Relative: 2.2 % (ref 0.0–5.0)
HCT: 43 % (ref 36.0–46.0)
Hemoglobin: 14.2 g/dL (ref 12.0–15.0)
Lymphocytes Relative: 35.5 % (ref 12.0–46.0)
Lymphs Abs: 2.2 10*3/uL (ref 0.7–4.0)
MCHC: 33 g/dL (ref 30.0–36.0)
MCV: 88.9 fl (ref 78.0–100.0)
Monocytes Absolute: 0.4 10*3/uL (ref 0.1–1.0)
Monocytes Relative: 6.8 % (ref 3.0–12.0)
Neutro Abs: 3.3 10*3/uL (ref 1.4–7.7)
Neutrophils Relative %: 53.5 % (ref 43.0–77.0)
Platelets: 301 10*3/uL (ref 150.0–400.0)
RBC: 4.84 Mil/uL (ref 3.87–5.11)
RDW: 13.9 % (ref 11.5–15.5)
WBC: 6.3 10*3/uL (ref 4.0–10.5)

## 2018-04-15 LAB — COMPREHENSIVE METABOLIC PANEL
ALT: 14 U/L (ref 0–35)
AST: 18 U/L (ref 0–37)
Albumin: 4 g/dL (ref 3.5–5.2)
Alkaline Phosphatase: 79 U/L (ref 39–117)
BUN: 18 mg/dL (ref 6–23)
CO2: 29 mEq/L (ref 19–32)
Calcium: 9.9 mg/dL (ref 8.4–10.5)
Chloride: 104 mEq/L (ref 96–112)
Creatinine, Ser: 1.2 mg/dL (ref 0.40–1.20)
GFR: 46.14 mL/min — ABNORMAL LOW (ref >60.00)
Glucose, Bld: 82 mg/dL (ref 70–99)
Potassium: 4.2 mEq/L (ref 3.5–5.1)
Sodium: 141 mEq/L (ref 135–145)
Total Bilirubin: 0.3 mg/dL (ref 0.2–1.2)
Total Protein: 7 g/dL (ref 6.0–8.3)

## 2018-04-15 LAB — TSH: TSH: 1.83 u[IU]/mL (ref 0.35–4.50)

## 2018-04-15 LAB — LIPID PANEL
Cholesterol: 288 mg/dL — ABNORMAL HIGH (ref 0–200)
HDL: 42.4 mg/dL (ref >39.00)
NonHDL: 245.83
Total CHOL/HDL Ratio: 7
Triglycerides: 253 mg/dL — ABNORMAL HIGH (ref 0.0–149.0)
VLDL: 50.6 mg/dL — ABNORMAL HIGH (ref 0.0–40.0)

## 2018-04-15 LAB — VITAMIN B12: Vitamin B-12: 941 pg/mL — ABNORMAL HIGH (ref 211–911)

## 2018-04-15 LAB — LDL CHOLESTEROL, DIRECT: Direct LDL: 204 mg/dL

## 2018-04-15 MED ORDER — TETANUS-DIPHTH-ACELL PERTUSSIS 5-2.5-18.5 LF-MCG/0.5 IM SUSP: 0.5000 mL | Freq: Once | INTRAMUSCULAR | 0 refills | Status: AC

## 2018-04-15 NOTE — Progress Notes (Signed)
Tina Smith is a 77 y.o. female is here to [place].   Patient Care Team: Briscoe Deutscher, DO (Family Medicine)   History of Present Illness:   HPI:   1. Pure hypercholesterolemia.   Is the patient taking medications without problems? []   YES  [x]   NO Trying to exercise on a regular basis? [x]   YES  []   NO Compliant with diet? [x]   YES  []   NO   Lipids:    Component Value Date/Time   CHOL 288 (H) 04/15/2018 1326   TRIG 253.0 (H) 04/15/2018 1326   HDL 42.40 04/15/2018 1326   LDLDIRECT 204.0 04/15/2018 1326   VLDL 50.6 (H) 04/15/2018 1326   CHOLHDL 7 04/15/2018 1326     Health Maintenance Due  Topic Date Due  . TETANUS/TDAP  12/09/1958   Depression screen PHQ 2/9 04/15/2018  Decreased Interest 0  Down, Depressed, Hopeless 0  PHQ - 2 Score 0    PMHx, SurgHx, SocialHx, Medications, and Allergies were reviewed in the Visit Navigator and updated as appropriate.   Past Medical History:  Diagnosis Date  . Colon polyps 02/26/2012  . Deafness, partial   . Diverticulosis 02/26/2012  . HLD (hyperlipidemia)   . OA (osteoarthritis)   . Rheumatic fever   . Sleep apnea, does not tolerate CPAP      Past Surgical History:  Procedure Laterality Date  . BREAST EXCISIONAL BIOPSY Left 1990   BENIGN  . COLONOSCOPY  2003, 2013  . CYSTOCELE REPAIR  04/2011  . TONSILLECTOMY  1944     Family History  Problem Relation Age of Onset  . Heart disease Mother   . Colitis Mother   . Colon cancer Neg Hx   . Stomach cancer Neg Hx   . Colon polyps Neg Hx   . Esophageal cancer Neg Hx   . Diabetes Neg Hx   . Kidney disease Neg Hx     Social History   Tobacco Use  . Smoking status: Never Smoker  . Smokeless tobacco: Never Used  Substance Use Topics  . Alcohol use: Yes    Comment: occasional  . Drug use: No    Current Medications and Allergies:   .  Calcium-Magnesium-Vitamin D (CALCIUM MAGNESIUM PO), Take by mouth daily. Pt takes liquid calcium magnesium, Disp: ,  Rfl:  .  Cholecalciferol (VITAMIN D) 1000 UNITS capsule, Take 2,000 Units by mouth daily. , Disp: , Rfl:  .  Glucosamine 500 MG TABS, Take by mouth daily.  , Disp: , Rfl:  .  Multiple Minerals-Vitamins (CALCIUM CITRATE PLUS/MAGNESIUM PO), Take 2 tablets by mouth 2 (two) times daily., Disp: , Rfl:  .  Multiple Vitamin (MULTIVITAMIN) tablet, Take 1 tablet by mouth daily.  , Disp: , Rfl:  .  psyllium (METAMUCIL) 58.6 % powder, Take 1 packet by mouth 3 (three) times daily., Disp: , Rfl:   No Known Allergies   Review of Systems:   Pertinent items are noted in the HPI. Otherwise, ROS is negative.  Vitals:   Vitals:   04/15/18 1259  BP: 118/66  Pulse: 70  Temp: 98.5 F (36.9 C)  TempSrc: Oral  SpO2: 96%  Weight: 136 lb 12.8 oz (62.1 kg)  Height: 5' 4.25" (1.632 m)     Body mass index is 23.3 kg/m.  Physical Exam:   Physical Exam  Constitutional: She appears well-nourished.  HENT:  Head: Normocephalic and atraumatic.  Eyes: Pupils are equal, round, and reactive to light. EOM are normal.  Neck: Normal range of motion. Neck supple.  Cardiovascular: Normal rate, regular rhythm, normal heart sounds and intact distal pulses.  Pulmonary/Chest: Effort normal.  Abdominal: Soft.  Skin: Skin is warm.  Psychiatric: She has a normal mood and affect. Her behavior is normal.  Nursing note and vitals reviewed.  Assessment and Plan:   Diagnoses and all orders for this visit:  Pure hypercholesterolemia -     Comprehensive metabolic panel -     Lipid panel -     LDL cholesterol, direct  Chronic fatigue -     CBC with Differential/Platelet -     TSH  Vitamin B 12 deficiency -     Vitamin B12  Encounter for immunization -     Flu vaccine HIGH DOSE PF  Need for immunization against influenza -     Flu vaccine HIGH DOSE PF  Need for Tdap vaccination -     Tdap (BOOSTRIX) 5-2.5-18.5 LF-MCG/0.5 injection; Inject 0.5 mLs into the muscle once for 1 dose.  Chronic idiopathic  constipation    . Reviewed expectations re: course of current medical issues. . Discussed self-management of symptoms. . Outlined signs and symptoms indicating need for more acute intervention. . Patient verbalized understanding and all questions were answered. Marland Kitchen Health Maintenance issues including appropriate healthy diet, exercise, and smoking avoidance were discussed with patient. . See orders for this visit as documented in the electronic medical record. . Patient received an After Visit Summary.   Briscoe Deutscher, DO [place], [facility] 04/24/2018

## 2018-04-17 ENCOUNTER — Other Ambulatory Visit: Payer: Self-pay

## 2018-04-17 DIAGNOSIS — E785 Hyperlipidemia, unspecified: Secondary | ICD-10-CM

## 2018-04-20 ENCOUNTER — Encounter: Payer: Self-pay | Admitting: Family Medicine

## 2018-04-24 DIAGNOSIS — Z4689 Encounter for fitting and adjustment of other specified devices: Secondary | ICD-10-CM | POA: Diagnosis not present

## 2018-04-24 DIAGNOSIS — N952 Postmenopausal atrophic vaginitis: Secondary | ICD-10-CM | POA: Diagnosis not present

## 2018-04-24 DIAGNOSIS — N814 Uterovaginal prolapse, unspecified: Secondary | ICD-10-CM | POA: Diagnosis not present

## 2018-06-24 ENCOUNTER — Telehealth: Payer: Self-pay | Admitting: Internal Medicine

## 2018-06-24 NOTE — Telephone Encounter (Signed)
Called patient and LVM to call back to schedule appointment. °

## 2018-09-04 DIAGNOSIS — Z1283 Encounter for screening for malignant neoplasm of skin: Secondary | ICD-10-CM | POA: Diagnosis not present

## 2018-09-04 DIAGNOSIS — X32XXXD Exposure to sunlight, subsequent encounter: Secondary | ICD-10-CM | POA: Diagnosis not present

## 2018-09-04 DIAGNOSIS — L570 Actinic keratosis: Secondary | ICD-10-CM | POA: Diagnosis not present

## 2018-09-04 DIAGNOSIS — L821 Other seborrheic keratosis: Secondary | ICD-10-CM | POA: Diagnosis not present

## 2018-12-23 DIAGNOSIS — Z4689 Encounter for fitting and adjustment of other specified devices: Secondary | ICD-10-CM | POA: Diagnosis not present

## 2018-12-23 DIAGNOSIS — N952 Postmenopausal atrophic vaginitis: Secondary | ICD-10-CM | POA: Diagnosis not present

## 2018-12-23 DIAGNOSIS — N814 Uterovaginal prolapse, unspecified: Secondary | ICD-10-CM | POA: Diagnosis not present

## 2019-02-17 DIAGNOSIS — H52203 Unspecified astigmatism, bilateral: Secondary | ICD-10-CM | POA: Diagnosis not present

## 2019-02-17 DIAGNOSIS — H2513 Age-related nuclear cataract, bilateral: Secondary | ICD-10-CM | POA: Diagnosis not present

## 2019-04-21 DIAGNOSIS — N952 Postmenopausal atrophic vaginitis: Secondary | ICD-10-CM | POA: Diagnosis not present

## 2019-04-21 DIAGNOSIS — Z4689 Encounter for fitting and adjustment of other specified devices: Secondary | ICD-10-CM | POA: Diagnosis not present

## 2019-04-21 DIAGNOSIS — Z23 Encounter for immunization: Secondary | ICD-10-CM | POA: Diagnosis not present

## 2019-04-21 DIAGNOSIS — N814 Uterovaginal prolapse, unspecified: Secondary | ICD-10-CM | POA: Diagnosis not present

## 2019-07-23 DIAGNOSIS — Z4689 Encounter for fitting and adjustment of other specified devices: Secondary | ICD-10-CM | POA: Diagnosis not present

## 2019-07-23 DIAGNOSIS — N814 Uterovaginal prolapse, unspecified: Secondary | ICD-10-CM | POA: Diagnosis not present

## 2019-07-23 DIAGNOSIS — N952 Postmenopausal atrophic vaginitis: Secondary | ICD-10-CM | POA: Diagnosis not present

## 2019-08-30 ENCOUNTER — Ambulatory Visit: Payer: Medicare Other | Attending: Internal Medicine

## 2019-08-30 DIAGNOSIS — Z23 Encounter for immunization: Secondary | ICD-10-CM | POA: Insufficient documentation

## 2019-08-30 NOTE — Progress Notes (Signed)
   Covid-19 Vaccination Clinic  Name:  MACAIAH WALSH    MRN: [id number] DOB: 04/06/1941  08/30/2019  Ms. Caruthers was observed post Covid-19 immunization for 15 minutes without incidence. She was provided with Vaccine Information Sheet and instruction to access the V-Safe system.   Ms. Biffle was instructed to call 911 with any severe reactions post vaccine: Marland Kitchen Difficulty breathing  . Swelling of your face and throat  . A fast heartbeat  . A bad rash all over your body  . Dizziness and weakness    Immunizations Administered    Name Date Dose VIS Date Route   Pfizer COVID-19 Vaccine 08/30/2019 11:18 AM 0.3 mL 06/19/2019 Intramuscular   Manufacturer: [place]   Lot: [id number]   [place]: [id number]

## 2019-09-23 ENCOUNTER — Ambulatory Visit: Payer: Medicare Other | Attending: Internal Medicine

## 2019-09-23 DIAGNOSIS — Z23 Encounter for immunization: Secondary | ICD-10-CM

## 2019-09-23 NOTE — Progress Notes (Signed)
   Covid-19 Vaccination Clinic  Name:  WYLMA SOTAK    MRN: [id number] DOB: [birth year]  09/23/2019  Ms. Korenek was observed post Covid-19 immunization for 15 minutes without incident. She was provided with Vaccine Information Sheet and instruction to access the V-Safe system.   Ms. Pearcey was instructed to call 911 with any severe reactions post vaccine: Marland Kitchen Difficulty breathing  . Swelling of face and throat  . A fast heartbeat  . A bad rash all over body  . Dizziness and weakness   Immunizations Administered    Name Date Dose VIS Date Route   Pfizer COVID-19 Vaccine 09/23/2019 11:01 AM 0.3 mL 06/19/2019 Intramuscular   Manufacturer: [place]   Lot: [id number]   [place]: [id number]

## 2019-11-09 DIAGNOSIS — Z1283 Encounter for screening for malignant neoplasm of skin: Secondary | ICD-10-CM | POA: Diagnosis not present

## 2019-11-09 DIAGNOSIS — L821 Other seborrheic keratosis: Secondary | ICD-10-CM | POA: Diagnosis not present

## 2019-11-09 DIAGNOSIS — X32XXXD Exposure to sunlight, subsequent encounter: Secondary | ICD-10-CM | POA: Diagnosis not present

## 2019-11-09 DIAGNOSIS — L570 Actinic keratosis: Secondary | ICD-10-CM | POA: Diagnosis not present

## 2020-01-29 ENCOUNTER — Encounter: Payer: Self-pay | Admitting: Family Medicine

## 2020-01-29 ENCOUNTER — Other Ambulatory Visit: Payer: Self-pay

## 2020-01-29 ENCOUNTER — Ambulatory Visit (INDEPENDENT_AMBULATORY_CARE_PROVIDER_SITE_OTHER): Payer: Medicare Other | Admitting: Family Medicine

## 2020-01-29 VITALS — BP 118/70 | HR 78 | Temp 98.0°F | Resp 18 | Ht 64.0 in | Wt 134.4 lb

## 2020-01-29 DIAGNOSIS — M899 Disorder of bone, unspecified: Secondary | ICD-10-CM | POA: Diagnosis not present

## 2020-01-29 DIAGNOSIS — R5382 Chronic fatigue, unspecified: Secondary | ICD-10-CM

## 2020-01-29 DIAGNOSIS — R202 Paresthesia of skin: Secondary | ICD-10-CM

## 2020-01-29 DIAGNOSIS — E782 Mixed hyperlipidemia: Secondary | ICD-10-CM | POA: Diagnosis not present

## 2020-01-29 DIAGNOSIS — E1169 Type 2 diabetes mellitus with other specified complication: Secondary | ICD-10-CM | POA: Diagnosis not present

## 2020-01-29 DIAGNOSIS — G9332 Myalgic encephalomyelitis/chronic fatigue syndrome: Secondary | ICD-10-CM

## 2020-01-29 DIAGNOSIS — N814 Uterovaginal prolapse, unspecified: Secondary | ICD-10-CM | POA: Insufficient documentation

## 2020-01-29 DIAGNOSIS — Z8679 Personal history of other diseases of the circulatory system: Secondary | ICD-10-CM | POA: Insufficient documentation

## 2020-01-29 DIAGNOSIS — E785 Hyperlipidemia, unspecified: Secondary | ICD-10-CM | POA: Diagnosis not present

## 2020-01-29 LAB — COMPREHENSIVE METABOLIC PANEL
Alkaline phosphatase (APISO): 72 U/L (ref 37–153)
BUN/Creatinine Ratio: 18 (calc) (ref 6–22)
CO2: 28 mmol/L (ref 20–32)
Calcium: 10.1 mg/dL (ref 8.6–10.4)
Chloride: 107 mmol/L (ref 98–110)
Potassium: 4.3 mmol/L (ref 3.5–5.3)

## 2020-01-29 LAB — LIPID PANEL: LDL Cholesterol (Calc): 196 mg/dL (calc) — ABNORMAL HIGH

## 2020-01-29 LAB — VITAMIN D 25 HYDROXY (VIT D DEFICIENCY, FRACTURES): Vit D, 25-Hydroxy: 86 ng/mL (ref 30–100)

## 2020-01-29 NOTE — Patient Instructions (Signed)
1) chronic fatigue: will check labs. As far as treatment goes exercise and diet are key. Other thoughts are a stimulant drug that I give twice a day called ritalin. Let's get labs back first  2) I want you to look up fibromyalgia and see if this sounds like anything else you have.   3) exercise is big in both of these.   4) for tingling it could be neuropathy. If mild and not hurting would not give medication. We can also send to neurology for nerve testing to confirm this.    Chronic Fatigue Syndrome Chronic fatigue syndrome (CFS) is a condition that causes extreme tiredness (fatigue). This fatigue does not improve with rest, and it gets worse with physical or mental activity. You may have several other symptoms along with fatigue. Symptoms may come and go, but they generally last for months. Sometimes, CFS gets better over time, but it can be a lifelong condition. There is no cure, but there are many possible treatments. You will need to work with your health care providers to find a treatment plan that works best for you. What are the causes? The cause of CFS is not known. There may be more than one cause. Possible causes include:  An infection.  An abnormal body defense system (immune system).  Low blood pressure.  Poor diet.  Physical or emotional stress. What increases the risk? You are more likely to develop this condition if:  You are female.  You are 69?80 years old.  You have a family history of CFS.  You live with a lot of emotional stress. What are the signs or symptoms? The main symptom of CFS is fatigue that is severe enough to interfere with day-to-day activities. This fatigue does not get better with rest, and it gets worse with physical or mental activity. There are eight other major symptoms of CFS:  Lack of energy (malaise) that lasts more than 24 hours after physical exertion.  Sleep that does not relieve fatigue (unrefreshing sleep).  Short-term memory  loss or confusion.  Joint pain without redness or swelling.  Muscle aches.  Headaches.  Painful and swollen glands (lymph nodes) in the neck or under the arms.  Sore throat. You may also have:  Abdominal cramps, constipation, or diarrhea (irritable bowel).  Chills.  Night sweats.  Vision changes.  Dizziness.  Mental confusion (brain fog).  Clumsiness.  Sensitivity to food, noise, or odors.  Mood swings, depression, or anxiety attacks. How is this diagnosed? There are no tests that can diagnose this condition. Your health care provider will make the diagnosis based on your medical history, a physical exam, and a mental health exam. However, it is important to make sure that your symptoms are not caused by another medical condition. You may have lab tests or X-rays to rule out other conditions. For your health care provider to diagnose CFS:  You must have had fatigue for at least 6 straight months.  Fatigue must be your first symptom, and it must be severe enough to interfere with day-to-day activities.  There must be no other cause found for the fatigue.  You must also have at least four of the eight other major symptoms of CFS. How is this treated? There is no cure for CFS. The condition affects everyone differently. You will need to work with your team of health care providers to find the best treatments for your symptoms. Your team may include your primary care provider, physical and exercise therapists, and  mental health therapists. Treatment may include:  Improving sleep with a regular bedtime routine.  Avoiding caffeine, alcohol, and tobacco.  Doing light exercise and stretching during the day.  Taking medicines to help you sleep or to relieve joint or muscle pain.  Learning and practicing relaxation techniques.  Using memory aids or doing brainteasers to improve memory and concentration.  Seeing a mental health therapist to evaluate and treat depression,  if necessary.  Trying massage therapy, acupuncture, and movement exercises, such as yoga or tai chi. Follow these instructions at home:  Activity  Exercise regularly, as told by your health care provider.  Avoid fatigue by pacing yourself during the day and getting enough sleep at night.  Go to bed and get up at the same time every day. Eating and drinking  Avoid caffeine and alcohol.  Avoid heavy meals in the evening.  Eat a well-balanced diet. General instructions  Take over-the-counter and prescription medicines only as told by your health care provider.  Do not use herbal or dietary supplements unless they are approved by your health care provider.  Maintain a healthy weight.  Avoid stress and use stress-reducing techniques that you learn in therapy.  Do not use any products that contain nicotine or tobacco, such as cigarettes and e-cigarettes. If you need help quitting, ask your health care provider.  Consider joining a CFS support group.  Keep all follow-up visits as told by your health care provider. This is important. Contact a health care provider if:  Your symptoms do not get better or they get worse.  You feel angry, guilty, anxious, or depressed. This information is not intended to replace advice given to you by your health care provider. Make sure you discuss any questions you have with your health care provider. Document Revised: 06/07/2017 Document Reviewed: 10/03/2015 Elsevier Patient Education  2020 [employer].

## 2020-01-29 NOTE — Progress Notes (Signed)
Patient: Tina Smith MRN: [id number] DOB: [birth year] PCP: Orma Flaming, MD     Subjective:  Chief Complaint  Patient presents with  . Transitions Of Care    No recent CPE. She has some bilateral numbness to her feet for the last year.     HPI: The patient is a 79 y.o. female who presents today for establishing care. She has a history of OA, osteopenia, dyslipidemia and rheumatic fever as a child. She also has complaints of chronic fatigue for years with work up including MRI brain, echo/stress test, labs, psychiatry work up with depression ruled out. Still has complaints of chronic fatigue. Asked about fibromyalgia and she has no pain at trigger points.   Hyperlipidemia Was on statins for a while, but stopped due to side effects.   Tingling in her toes bilaterally -she has tingling in her toes only with movement. Has been going on x 1 year. She has no pain or gait disturbance. She doesn't feel like it's getting worse over the year. Denies any claudication. She states if anything it's gotten better.   Mmg: 10/2016. Normal, no plans to continue Colonoscopy: no longer needed Immunizations UTD except for tdap.    Review of Systems  Constitutional: Positive for fatigue. Negative for chills and fever.  HENT: Negative for dental problem, ear pain, hearing loss and trouble swallowing.   Eyes: Negative for visual disturbance.  Respiratory: Negative for cough, chest tightness and shortness of breath.   Cardiovascular: Negative for chest pain, palpitations and leg swelling.  Gastrointestinal: Negative for abdominal pain, blood in stool, diarrhea and nausea.  Endocrine: Negative for cold intolerance, polydipsia, polyphagia and polyuria.  Genitourinary: Negative for dysuria and hematuria.  Musculoskeletal: Negative for arthralgias.  Skin: Negative for rash.  Neurological: Negative for dizziness and headaches.  Psychiatric/Behavioral: Negative for dysphoric mood and sleep disturbance. The  patient is not nervous/anxious.     Allergies Patient has No Known Allergies.  Past Medical History Patient  has a past medical history of Colon polyps (02/26/2012), Deafness, partial, Diverticulosis (02/26/2012), HLD (hyperlipidemia), OA (osteoarthritis), Rheumatic fever, and Sleep apnea, does not tolerate CPAP.  Surgical History Patient  has a past surgical history that includes Tonsillectomy (1944); Colonoscopy (2003, 2013); Cystocele repair (04/2011); and Breast excisional biopsy (Left, 1990).  Family History Pateint's family history includes Colitis in her mother; Heart disease in her mother.  Social History Patient  reports that she has never smoked. She has never used smokeless tobacco. She reports current alcohol use. She reports that she does not use drugs.    Objective: Vitals:   01/29/20 1324  BP: 118/70  Pulse: 78  Resp: 18  Temp: 98 F (36.7 C)  TempSrc: Temporal  SpO2: 95%  Weight: 134 lb 6.4 oz (61 kg)  Height: 5\' 4"  (1.626 m)    Body mass index is 23.07 kg/m.  Physical Exam Vitals reviewed.  Constitutional:      Appearance: Normal appearance. She is well-developed and normal weight.  HENT:     Head: Normocephalic and atraumatic.     Right Ear: Tympanic membrane, ear canal and external ear normal.     Left Ear: Tympanic membrane, ear canal and external ear normal.     Ears:     Comments: HA in left ear     Nose: Nose normal.     Mouth/Throat:     Mouth: Mucous membranes are moist.  Eyes:     Extraocular Movements: Extraocular movements intact.     Conjunctiva/sclera:  Conjunctivae normal.     Pupils: Pupils are equal, round, and reactive to light.  Neck:     Thyroid: No thyromegaly.     Vascular: No carotid bruit.  Cardiovascular:     Rate and Rhythm: Normal rate and regular rhythm.     Pulses: Normal pulses.     Heart sounds: Normal heart sounds. No murmur heard.   Pulmonary:     Effort: Pulmonary effort is normal.     Breath sounds: Normal  breath sounds.  Abdominal:     General: Abdomen is flat. Bowel sounds are normal. There is no distension.     Palpations: Abdomen is soft.     Tenderness: There is no abdominal tenderness.  Musculoskeletal:     Cervical back: Normal range of motion and neck supple.  Lymphadenopathy:     Cervical: No cervical adenopathy.  Skin:    General: Skin is warm and dry.     Capillary Refill: Capillary refill takes less than 2 seconds.     Findings: No rash.  Neurological:     General: No focal deficit present.     Mental Status: She is alert and oriented to person, place, and time.     Cranial Nerves: No cranial nerve deficit.     Coordination: Coordination normal.     Deep Tendon Reflexes: Reflexes normal.  Psychiatric:        Mood and Affect: Mood normal.        Behavior: Behavior normal.      Office Visit from 01/29/2020 in [facility]  PHQ-2 Total Score 0         Assessment/plan: 1. Chronic fatigue syndrome Long history with large work up from what her paperwork shows. She has written down everything. Will check labs and discussed diet/exercise. Other thoughts would be ritalin at low dose, but discussed there are side effects from this. I also want her to read up on fibromyalgia. She will read information and let me know her thoughts.  - CBC with Differential/Platelet; Future - Comprehensive metabolic panel; Future - TSH; Future - VITAMIN D 25 Hydroxy (Vit-D Deficiency, Fractures); Future - VITAMIN D 25 Hydroxy (Vit-D Deficiency, Fractures) - TSH - Comprehensive metabolic panel - CBC with Differential/Platelet  2. Hyperlipidemia   - Lipid panel; Future - Lipid panel  3. Disorder of bone  - VITAMIN D 25 Hydroxy (Vit-D Deficiency, Fractures); Future - VITAMIN D 25 Hydroxy (Vit-D Deficiency, Fractures)  4. Tingling of both feet Exam normal and she states it's improving. Since not painful, not falling and very mild we will check labs and do watchful  waiting. If she starts to have increasing symptoms, pain, etc. discussed referral to neurology and trial of gabapentin.  - Vitamin B12; Future - Vitamin B12   This visit occurred during the SARS-CoV-2 public health emergency.  Safety protocols were in place, including screening questions prior to the visit, additional usage of staff PPE, and extensive cleaning of exam room while observing appropriate contact time as indicated for disinfecting solutions.     Return in about 6 months (around 07/31/2020) for fatigue/tingling .     Orma Flaming, MD [place]  01/29/2020

## 2020-01-29 NOTE — Progress Notes (Deleted)
Patient: Tina Smith MRN: [id number] DOB: August 11, 1940 PCP: No primary care provider on file.     Subjective:  Chief Complaint  Patient presents with  . Transitions Of Care    No recent CPE. She has some bilateral numbness to her feet for the last year.     HPI: The patient is a 79 y.o. female who presents today for annual exam. {He/she (caps):30048} denies any changes to past medical history. There have been no recent hospitalizations. They {Actions; are/are not:16769} following a well balanced diet and exercise plan. Weight has been {trend:16658}. No complaints today.   Immunization History  Administered Date(s) Administered  . Influenza Split 04/24/2011, 04/15/2012  . Influenza Whole 05/09/2010  . Influenza, High Dose Seasonal PF 04/15/2018  . Influenza,inj,Quad PF,6+ Mos 05/12/2013, 05/13/2014, 04/14/2015  . PFIZER SARS-COV-2 Vaccination 08/30/2019, 09/23/2019  . Pneumococcal Conjugate-13 05/13/2014  . Pneumococcal Polysaccharide-23 05/09/2005  . Zoster 09/02/2007  . Zoster Recombinat (Shingrix) 05/15/2017   Colonoscopy: Mammogram:  Pap smear:  PSA:   Review of Systems  Constitutional: Negative for chills and fever.  Respiratory: Negative for cough and shortness of breath.     Allergies Patient has No Known Allergies.  Past Medical History Patient  has a past medical history of Colon polyps (02/26/2012), Deafness, partial, Diverticulosis (02/26/2012), HLD (hyperlipidemia), OA (osteoarthritis), Rheumatic fever, and Sleep apnea, does not tolerate CPAP.  Surgical History Patient  has a past surgical history that includes Tonsillectomy (1944); Colonoscopy (2003, 2013); Cystocele repair (04/2011); and Breast excisional biopsy (Left, 1990).  Family History Pateint's family history includes Colitis in her mother; Heart disease in her mother.  Social History Patient  reports that she has never smoked. She has never used smokeless tobacco. She reports current alcohol use.  She reports that she does not use drugs.    Objective: Vitals:   01/29/20 1324  BP: 118/70  Pulse: 78  Resp: 18  Temp: 98 F (36.7 C)  TempSrc: Temporal  SpO2: 95%  Weight: 134 lb 6.4 oz (61 kg)  Height: 5\' 4"  (1.626 m)    Body mass index is 23.07 kg/m.  Physical Exam     Assessment/plan:   No problem-specific Assessment & Plan notes found for this encounter.    No follow-ups on file.     Orma Flaming, MD [place]  01/29/2020

## 2020-01-30 LAB — CBC WITH DIFFERENTIAL/PLATELET
Absolute Monocytes: 462 cells/uL (ref 200–950)
Basophils Absolute: 68 cells/uL (ref 0–200)
Basophils Relative: 1 %
Eosinophils Absolute: 150 cells/uL (ref 15–500)
Eosinophils Relative: 2.2 %
HCT: 43.2 % (ref 35.0–45.0)
Hemoglobin: 14.4 g/dL (ref 11.7–15.5)
Lymphs Abs: 2278 cells/uL (ref 850–3900)
MCH: 30.3 pg (ref 27.0–33.0)
MCHC: 33.3 g/dL (ref 32.0–36.0)
MCV: 90.9 fL (ref 80.0–100.0)
MPV: 10.1 fL (ref 7.5–12.5)
Monocytes Relative: 6.8 %
Neutro Abs: 3842 cells/uL (ref 1500–7800)
Neutrophils Relative %: 56.5 %
Platelets: 257 10*3/uL (ref 140–400)
RBC: 4.75 10*6/uL (ref 3.80–5.10)
RDW: 12.7 % (ref 11.0–15.0)
Total Lymphocyte: 33.5 %
WBC: 6.8 10*3/uL (ref 3.8–10.8)

## 2020-01-30 LAB — COMPREHENSIVE METABOLIC PANEL
AG Ratio: 1.6 (calc) (ref 1.0–2.5)
ALT: 17 U/L (ref 6–29)
AST: 22 U/L (ref 10–35)
Albumin: 4.1 g/dL (ref 3.6–5.1)
BUN: 24 mg/dL (ref 7–25)
Creat: 1.33 mg/dL — ABNORMAL HIGH (ref 0.60–0.88)
Globulin: 2.6 g/dL (calc) (ref 1.9–3.7)
Glucose, Bld: 94 mg/dL (ref 65–99)
Sodium: 141 mmol/L (ref 135–146)
Total Bilirubin: 0.4 mg/dL (ref 0.2–1.2)
Total Protein: 6.7 g/dL (ref 6.1–8.1)

## 2020-01-30 LAB — LIPID PANEL
Cholesterol: 278 mg/dL — ABNORMAL HIGH (ref <200)
HDL: 44 mg/dL — ABNORMAL LOW (ref >=50)
Non-HDL Cholesterol (Calc): 234 mg/dL (calc) — ABNORMAL HIGH (ref <130)
Total CHOL/HDL Ratio: 6.3 (calc) — ABNORMAL HIGH (ref <5.0)
Triglycerides: 202 mg/dL — ABNORMAL HIGH (ref <150)

## 2020-01-30 LAB — VITAMIN B12: Vitamin B-12: 1030 pg/mL (ref 200–1100)

## 2020-01-30 LAB — TSH: TSH: 1.33 mIU/L (ref 0.40–4.50)

## 2020-03-04 ENCOUNTER — Ambulatory Visit (INDEPENDENT_AMBULATORY_CARE_PROVIDER_SITE_OTHER): Payer: Medicare Other

## 2020-03-04 DIAGNOSIS — Z0000 Encounter for general adult medical examination without abnormal findings: Secondary | ICD-10-CM | POA: Diagnosis not present

## 2020-03-04 NOTE — Progress Notes (Signed)
Virtual Visit via Telephone Note  I connected with  Tina Smith on 03/04/20 at  1:45 PM EDT by telephone and verified that I am speaking with the correct person using two identifiers.  Medicare Annual Wellness visit completed telephonically due to Covid-19 pandemic.   Persons participating in this call: This Health Coach and this patient.   Location: Patient: Home Provider: Office   I discussed the limitations, risks, security and privacy concerns of performing an evaluation and management service by telephone and the availability of in person appointments. The patient expressed understanding and agreed to proceed.  Unable to perform video visit due to video visit attempted and failed and/or patient does not have video capability.   Some vital signs may be absent or patient reported.   Willette Brace, LPN    Subjective:   Tina Smith is a 79 y.o. female who presents for Medicare Annual (Subsequent) preventive examination.  Review of Systems     Cardiac Risk Factors include: advanced age (>70men, >75 women);dyslipidemia     Objective:    There were no vitals filed for this visit. There is no height or weight on file to calculate BMI.  Advanced Directives 03/04/2020  Does Patient Have a Medical Advance Directive? Yes  Type of Paramedic of [place];Living will  Copy of [place] in Chart? No - copy requested    Current Medications (verified) Outpatient Encounter Medications as of 03/04/2020  Medication Sig  . Biotin 5000 MCG TABS Take 5,000 mcg by mouth daily.  . Calcium Carbonate (CALCIUM 600 PO) Take 600 mg by mouth daily.  . Cholecalciferol (VITAMIN D) 1000 UNITS capsule Take 5,000 Units by mouth daily.   . COLLAGEN PO Take 40 mg by mouth at bedtime.  Marland Kitchen estradiol (ESTRACE VAGINAL) 0.1 MG/GM vaginal cream Place 1 Applicatorful vaginally. Every 10 days  . Magnesium 125 MG CAPS Take 125 mg by mouth in the morning and at  bedtime.  . Multiple Vitamin (MULTIVITAMIN) tablet Take 1 tablet by mouth daily.    . PSYLLIUM HUSK PO Take 610 mg by mouth daily.  . Sodium Hyaluronate, oral, (HYALURONIC ACID PO) Take 166 mg by mouth daily.  . vitamin B-12 (CYANOCOBALAMIN) 1000 MCG tablet Take 1,000 mcg by mouth daily.  . [DISCONTINUED] Coenzyme Q10 (COQ10) 200 MG CAPS Take 200 mg by mouth daily.   No facility-administered encounter medications on file as of 03/04/2020.    Allergies (verified) Patient has no known allergies.   History: Past Medical History:  Diagnosis Date  . Colon polyps 02/26/2012  . Deafness, partial   . Diverticulosis 02/26/2012  . HLD (hyperlipidemia)   . OA (osteoarthritis)   . Rheumatic fever   . Sleep apnea, does not tolerate CPAP    Past Surgical History:  Procedure Laterality Date  . BREAST EXCISIONAL BIOPSY Left 1990   BENIGN  . COLONOSCOPY  2003, 2013  . CYSTOCELE REPAIR  04/2011  . TONSILLECTOMY  1944   Family History  Problem Relation Age of Onset  . Heart disease Mother   . Colitis Mother   . Colon cancer Neg Hx   . Stomach cancer Neg Hx   . Colon polyps Neg Hx   . Esophageal cancer Neg Hx   . Diabetes Neg Hx   . Kidney disease Neg Hx    Social History   Socioeconomic History  . Marital status: Widowed    Spouse name: Not on file  . Number of children:  2  . Years of education: Not on file  . Highest education level: Not on file  Occupational History    Employer: RETIRED  Tobacco Use  . Smoking status: Never Smoker  . Smokeless tobacco: Never Used  Substance and Sexual Activity  . Alcohol use: Yes    Comment: occasional  . Drug use: No  . Sexual activity: Not on file  Other Topics Concern  . Not on file  Social History Narrative   Widowed, 1 son and 1 daughter   Retired   No caffeine   Social Determinants of Radio broadcast assistant Strain: [place]   . Difficulty of Paying Living Expenses: Not hard at all  Food Insecurity: No Food Insecurity  .  Worried About Charity fundraiser in the Last Year: Never true  . Ran Out of Food in the Last Year: Never true  Transportation Needs: No Transportation Needs  . Lack of Transportation (Medical): No  . Lack of Transportation (Non-Medical): No  Physical Activity: Inactive  . Days of Exercise per Week: 0 days  . Minutes of Exercise per Session: 0 min  Stress: No Stress Concern Present  . Feeling of Stress : Not at all  Social Connections: Moderately Isolated  . Frequency of Communication with Friends and Family: Twice a week  . Frequency of Social Gatherings with Friends and Family: Three times a week  . Attends Religious Services: More than 4 times per year  . Active Member of Clubs or Organizations: No  . Attends Archivist Meetings: Never  . Marital Status: Widowed    Tobacco Counseling Counseling given: Not Answered   Clinical Intake:  Pre-visit preparation completed: Yes  Pain : No/denies pain     BMI - recorded: 23.07 Nutritional Status: BMI of 19-24  Normal Nutritional Risks: None Diabetes: No  How often do you need to have someone help you when you read instructions, pamphlets, or other written materials from your doctor or pharmacy?: 1 - Never  Diabetic?No  Interpreter Needed?: No  Information entered by :: Dayton Martes   Activities of Daily Living In your present state of health, do you have any difficulty performing the following activities: 03/04/2020 01/29/2020  Hearing? Y N  Comment wears hearing aids -  Vision? Y N  Difficulty concentrating or making decisions? N N  Walking or climbing stairs? N N  Dressing or bathing? N N  Doing errands, shopping? N N  Preparing Food and eating ? N -  Using the Toilet? N -  In the past six months, have you accidently leaked urine? N -  Do you have problems with loss of bowel control? N -  Managing your Medications? N -  Managing your Finances? N -  Housekeeping or managing your Housekeeping? N -    Some recent data might be hidden    Patient Care Team: Orma Flaming, MD as PCP - General (Family Medicine) Janyth Pupa, DO as Consulting Physician (Obstetrics and Gynecology)  Indicate any recent Medical Services you may have received from other than [facility] providers in the past year (date may be approximate).     Assessment:   This is a routine wellness examination for Tina Smith.  Hearing/Vision screen  Hearing Screening   125Hz  250Hz  500Hz  1000Hz  2000Hz  3000Hz  4000Hz  6000Hz  8000Hz   Right ear:           Left ear:           Comments: Pt wears a hearing aid  for left ear  Vision Screening Comments: Pt follows up with Dr Celene Squibb for eye exams  Dietary issues and exercise activities discussed: Current Exercise Habits: Structured exercise class (goes to the [facility] for water aerobics 3 days a week), Type of exercise: Other - see comments (water aerobics), Time (Minutes): 60, Frequency (Times/Week): 3, Weekly Exercise (Minutes/Week): 180  Goals    . Patient Stated     Stop feeling tired most times      Depression Screen PHQ 2/9 Scores 03/04/2020 01/29/2020 04/15/2018 05/13/2014 02/03/2013  PHQ - 2 Score 0 0 0 0 0    Fall Risk Fall Risk  03/04/2020 01/29/2020 04/15/2018 05/13/2014 02/03/2013  Falls in the past year? 0 0 No No No  Number falls in past yr: 0 0 - - -  Injury with Fall? 0 0 - - -  Risk for fall due to : Impaired vision - - - -    Any stairs in or around the home? No  If so, are there any without handrails? No  Home free of loose throw rugs in walkways, pet beds, electrical cords, etc? Yes  Adequate lighting in your home to reduce risk of falls? Yes   ASSISTIVE DEVICES UTILIZED TO PREVENT FALLS:  Life alert? No  Use of a cane, walker or w/c? No  Grab bars in the bathroom? Yes  Shower chair or bench in shower? No  Elevated toilet seat or a handicapped toilet? No   TIMED UP AND GO:  Was the test performed? No .     Cognitive Function:     6CIT Screen 03/04/2020   What Year? 0 points  What month? 0 points  Count back from 20 0 points  Months in reverse 0 points  Repeat phrase 0 points    Immunizations Immunization History  Administered Date(s) Administered  . Influenza Split 04/24/2011, 04/15/2012  . Influenza Whole 05/09/2010  . Influenza, High Dose Seasonal PF 04/15/2018  . Influenza,inj,Quad PF,6+ Mos 05/12/2013, 05/13/2014, 04/14/2015  . PFIZER SARS-COV-2 Vaccination 08/30/2019, 09/23/2019  . Pneumococcal Conjugate-13 05/13/2014  . Pneumococcal Polysaccharide-23 05/09/2005  . Zoster 09/02/2007  . Zoster Recombinat (Shingrix) 05/15/2017    TDAP status: Due, Education has been provided regarding the importance of this vaccine. Advised may receive this vaccine at local pharmacy or Health Dept. Aware to provide a copy of the vaccination record if obtained from local pharmacy or Health Dept. Verbalized acceptance and understanding. Flu Vaccine status: Up to date Pneumococcal vaccine status: Up to date Covid-19 vaccine status: Completed vaccines  Qualifies for Shingles Vaccine? Yes   Zostavax completed No   Shingrix Completed?: No.    Education has been provided regarding the importance of this vaccine. Patient has been advised to call insurance company to determine out of pocket expense if they have not yet received this vaccine. Advised may also receive vaccine at local pharmacy or Health Dept. Verbalized acceptance and understanding.  Screening Tests Health Maintenance  Topic Date Due  . TETANUS/TDAP  Never done  . INFLUENZA VACCINE  02/07/2020  . DEXA SCAN  Completed  . COVID-19 Vaccine  Completed  . PNA vac Low Risk Adult  Completed    Health Maintenance  Health Maintenance Due  Topic Date Due  . TETANUS/TDAP  Never done  . INFLUENZA VACCINE  02/07/2020    Colorectal cancer screening: No longer required.  Mammogram status: No longer required.  Bone Density status: Completed 08/01/15. Results reflect: Bone density results:  OSTEOPENIA. Repeat every 2 years.  Additional Screening:    Vision Screening: Recommended annual ophthalmology exams for early detection of glaucoma and other disorders of the eye. Is the patient up to date with their annual eye exam?  Yes  Who is the provider or what is the name of the office in which the patient attends annual eye exams? Dr Celene Squibb    Dental Screening: Recommended annual dental exams for proper oral hygiene  Community Resource Referral / Chronic Care Management: CRR required this visit?  No   CCM required this visit?  No      Plan:     I have personally reviewed and noted the following in the patient's chart:   . Medical and social history . Use of alcohol, tobacco or illicit drugs  . Current medications and supplements . Functional ability and status . Nutritional status . Physical activity . Advanced directives . List of other physicians . Hospitalizations, surgeries, and ER visits in previous 12 months . Vitals . Screenings to include cognitive, depression, and falls . Referrals and appointments  In addition, I have reviewed and discussed with patient certain preventive protocols, quality metrics, and best practice recommendations. A written personalized care plan for preventive services as well as general preventive health recommendations were provided to patient.     Willette Brace, LPN   5/73/2256   Nurse Notes: Pt has made an appt for 04/14/20 @ 1:30 pm to discuss having an EKG related to her being tired at times. No complaints of palpitations, SOB or pain noted.

## 2020-03-04 NOTE — Patient Instructions (Addendum)
Tina Smith , Thank you for taking time to come for your Medicare Wellness Visit. I appreciate your ongoing commitment to your health goals. Please review the following plan we discussed and let me know if I can assist you in the future.   Screening recommendations/referrals: Colonoscopy: No longer required Mammogram: No longer required Bone Density: Done 08/01/15 Recommended yearly ophthalmology/optometry visit for glaucoma screening and checkup Recommended yearly dental visit for hygiene and checkup  Vaccinations: Influenza vaccine: Up to date Pneumococcal vaccine: Up to date Tdap vaccine: Due and discussed Shingles vaccine: Done 09/02/07 & 05/15/17   Covid-19:Completed 2/21 & 09/23/19  Advanced directives: Please bring a copy of your health care power of attorney and living will to the office at your convenience.   Conditions/risks identified: work on not feeling tired at times  Next appointment: Follow up in one year for your annual wellness visit    Preventive Care 65 Years and Older, Female Preventive care refers to lifestyle choices and visits with your health care provider that can promote health and wellness. What does preventive care include?  A yearly physical exam. This is also called an annual well check.  Dental exams once or twice a year.  Routine eye exams. Ask your health care provider how often you should have your eyes checked.  Personal lifestyle choices, including:  Daily care of your teeth and gums.  Regular physical activity.  Eating a healthy diet.  Avoiding tobacco and drug use.  Limiting alcohol use.  Practicing safe sex.  Taking low-dose aspirin every day.  Taking vitamin and mineral supplements as recommended by your health care provider. What happens during an annual well check? The services and screenings done by your health care provider during your annual well check will depend on your age, overall health, lifestyle risk factors, and  family history of disease. Counseling  Your health care provider may ask you questions about your:  Alcohol use.  Tobacco use.  Drug use.  Emotional well-being.  Home and relationship well-being.  Sexual activity.  Eating habits.  History of falls.  Memory and ability to understand (cognition).  Work and work Statistician.  Reproductive health. Screening  You may have the following tests or measurements:  Height, weight, and BMI.  Blood pressure.  Lipid and cholesterol levels. These may be checked every 5 years, or more frequently if you are over 36 years old.  Skin check.  Lung cancer screening. You may have this screening every year starting at age 31 if you have a 30-pack-year history of smoking and currently smoke or have quit within the past 15 years.  Fecal occult blood test (FOBT) of the stool. You may have this test every year starting at age 65.  Flexible sigmoidoscopy or colonoscopy. You may have a sigmoidoscopy every 5 years or a colonoscopy every 10 years starting at age 10.  Hepatitis C blood test.  Hepatitis B blood test.  Sexually transmitted disease (STD) testing.  Diabetes screening. This is done by checking your blood sugar (glucose) after you have not eaten for a while (fasting). You may have this done every 1-3 years.  Bone density scan. This is done to screen for osteoporosis. You may have this done starting at age 56.  Mammogram. This may be done every 1-2 years. Talk to your health care provider about how often you should have regular mammograms. Talk with your health care provider about your test results, treatment options, and if necessary, the need for more tests. Vaccines  Your health care provider may recommend certain vaccines, such as:  Influenza vaccine. This is recommended every year.  Tetanus, diphtheria, and acellular pertussis (Tdap, Td) vaccine. You may need a Td booster every 10 years.  Zoster vaccine. You may need this  after age 73.  Pneumococcal 13-valent conjugate (PCV13) vaccine. One dose is recommended after age 57.  Pneumococcal polysaccharide (PPSV23) vaccine. One dose is recommended after age 69. Talk to your health care provider about which screenings and vaccines you need and how often you need them. This information is not intended to replace advice given to you by your health care provider. Make sure you discuss any questions you have with your health care provider. Document Released: 07/22/2015 Document Revised: 03/14/2016 Document Reviewed: 04/26/2015 Elsevier Interactive Patient Education  2017 [place] Prevention in the Home Falls can cause injuries. They can happen to people of all ages. There are many things you can do to make your home safe and to help prevent falls. What can I do on the outside of my home?  Regularly fix the edges of walkways and driveways and fix any cracks.  Remove anything that might make you trip as you walk through a door, such as a raised step or threshold.  Trim any bushes or trees on the path to your home.  Use bright outdoor lighting.  Clear any walking paths of anything that might make someone trip, such as rocks or tools.  Regularly check to see if handrails are loose or broken. Make sure that both sides of any steps have handrails.  Any raised decks and porches should have guardrails on the edges.  Have any leaves, snow, or ice cleared regularly.  Use sand or salt on walking paths during winter.  Clean up any spills in your garage right away. This includes oil or grease spills. What can I do in the bathroom?  Use night lights.  Install grab bars by the toilet and in the tub and shower. Do not use towel bars as grab bars.  Use non-skid mats or decals in the tub or shower.  If you need to sit down in the shower, use a plastic, non-slip stool.  Keep the floor dry. Clean up any water that spills on the floor as soon as it  happens.  Remove soap buildup in the tub or shower regularly.  Attach bath mats securely with double-sided non-slip rug tape.  Do not have throw rugs and other things on the floor that can make you trip. What can I do in the bedroom?  Use night lights.  Make sure that you have a light by your bed that is easy to reach.  Do not use any sheets or blankets that are too big for your bed. They should not hang down onto the floor.  Have a firm chair that has side arms. You can use this for support while you get dressed.  Do not have throw rugs and other things on the floor that can make you trip. What can I do in the kitchen?  Clean up any spills right away.  Avoid walking on wet floors.  Keep items that you use a lot in easy-to-reach places.  If you need to reach something above you, use a strong step stool that has a grab bar.  Keep electrical cords out of the way.  Do not use floor polish or wax that makes floors slippery. If you must use wax, use non-skid floor wax.  Do  not have throw rugs and other things on the floor that can make you trip. What can I do with my stairs?  Do not leave any items on the stairs.  Make sure that there are handrails on both sides of the stairs and use them. Fix handrails that are broken or loose. Make sure that handrails are as long as the stairways.  Check any carpeting to make sure that it is firmly attached to the stairs. Fix any carpet that is loose or worn.  Avoid having throw rugs at the top or bottom of the stairs. If you do have throw rugs, attach them to the floor with carpet tape.  Make sure that you have a light switch at the top of the stairs and the bottom of the stairs. If you do not have them, ask someone to add them for you. What else can I do to help prevent falls?  Wear shoes that:  Do not have high heels.  Have rubber bottoms.  Are comfortable and fit you well.  Are closed at the toe. Do not wear sandals.  If you  use a stepladder:  Make sure that it is fully opened. Do not climb a closed stepladder.  Make sure that both sides of the stepladder are locked into place.  Ask someone to hold it for you, if possible.  Clearly mark and make sure that you can see:  Any grab bars or handrails.  First and last steps.  Where the edge of each step is.  Use tools that help you move around (mobility aids) if they are needed. These include:  Canes.  Walkers.  Scooters.  Crutches.  Turn on the lights when you go into a dark area. Replace any light bulbs as soon as they burn out.  Set up your furniture so you have a clear path. Avoid moving your furniture around.  If any of your floors are uneven, fix them.  If there are any pets around you, be aware of where they are.  Review your medicines with your doctor. Some medicines can make you feel dizzy. This can increase your chance of falling. Ask your doctor what other things that you can do to help prevent falls. This information is not intended to replace advice given to you by your health care provider. Make sure you discuss any questions you have with your health care provider. Document Released: 04/21/2009 Document Revised: 12/01/2015 Document Reviewed: 07/30/2014 Elsevier Interactive Patient Education  2017 [employer].

## 2020-03-10 ENCOUNTER — Ambulatory Visit: Payer: Medicare Other | Admitting: Family Medicine

## 2020-04-14 ENCOUNTER — Encounter: Payer: Self-pay | Admitting: Family Medicine

## 2020-04-14 ENCOUNTER — Ambulatory Visit (INDEPENDENT_AMBULATORY_CARE_PROVIDER_SITE_OTHER): Payer: Medicare Other | Admitting: Family Medicine

## 2020-04-14 ENCOUNTER — Other Ambulatory Visit: Payer: Self-pay

## 2020-04-14 VITALS — BP 126/75 | HR 73 | Temp 97.7°F | Ht 64.0 in | Wt 134.8 lb

## 2020-04-14 DIAGNOSIS — Z4689 Encounter for fitting and adjustment of other specified devices: Secondary | ICD-10-CM

## 2020-04-14 DIAGNOSIS — E785 Hyperlipidemia, unspecified: Secondary | ICD-10-CM

## 2020-04-14 DIAGNOSIS — Z23 Encounter for immunization: Secondary | ICD-10-CM

## 2020-04-14 DIAGNOSIS — R7989 Other specified abnormal findings of blood chemistry: Secondary | ICD-10-CM

## 2020-04-14 DIAGNOSIS — R5382 Chronic fatigue, unspecified: Secondary | ICD-10-CM

## 2020-04-14 DIAGNOSIS — G9332 Myalgic encephalomyelitis/chronic fatigue syndrome: Secondary | ICD-10-CM

## 2020-04-14 NOTE — Progress Notes (Signed)
ekg 

## 2020-04-14 NOTE — Progress Notes (Signed)
Patient: Tina Smith MRN: [id number] DOB: Nov 26, 1940 PCP: Orma Flaming, MD     Subjective:  Chief Complaint  Patient presents with  . Fatigue  . pessary maintenance  . elevated creatinine    HPI: The patient is a 79 y.o. female who presents today for fatigue follow up. I wanted her to look up fibromyalgia and she isn't sure if she has this. She does have chronic fatigue syndrome. She states she is still tired all of the time. She states she will walk around the bed and be tired. No other changes or new complaints.   She also needs a new referral for a gyn for pessary maintenance.   Hyperlipidemia Her LDL is 196. She declines any statin  Elevated creatinine Repeat labs  Flu shot.  Wants to know where to go get her covid booster.   Review of Systems  Constitutional: Negative for fatigue.  Respiratory: Negative for cough and shortness of breath.   Cardiovascular: Negative for chest pain and palpitations.  Neurological: Negative for dizziness and headaches.    Allergies Patient has No Known Allergies.  Past Medical History Patient  has a past medical history of Colon polyps (02/26/2012), Deafness, partial, Diverticulosis (02/26/2012), HLD (hyperlipidemia), OA (osteoarthritis), Rheumatic fever, and Sleep apnea, does not tolerate CPAP.  Surgical History Patient  has a past surgical history that includes Tonsillectomy (1944); Colonoscopy (2003, 2013); Cystocele repair (04/2011); and Breast excisional biopsy (Left, 1990).  Family History Pateint's family history includes Colitis in her mother; Heart disease in her mother.  Social History Patient  reports that she has never smoked. She has never used smokeless tobacco. She reports current alcohol use. She reports that she does not use drugs.    Objective: Vitals:   04/14/20 1324  BP: 126/75  Pulse: 73  Temp: 97.7 F (36.5 C)  TempSrc: Temporal  SpO2: 96%  Weight: 134 lb 12.8 oz (61.1 kg)  Height: 5\' 4"  (1.626 m)     Body mass index is 23.14 kg/m.  Physical Exam Vitals reviewed.  Constitutional:      Appearance: Normal appearance. She is normal weight.  HENT:     Head: Normocephalic and atraumatic.  Cardiovascular:     Rate and Rhythm: Normal rate and regular rhythm.     Heart sounds: Normal heart sounds.  Pulmonary:     Effort: Pulmonary effort is normal.     Breath sounds: Normal breath sounds.  Abdominal:     General: Bowel sounds are normal.     Palpations: Abdomen is soft.  Musculoskeletal:     Cervical back: Normal range of motion and neck supple.  Skin:    General: Skin is warm.     Capillary Refill: Capillary refill takes less than 2 seconds.  Neurological:     General: No focal deficit present.     Mental Status: She is alert and oriented to person, place, and time.  Psychiatric:        Mood and Affect: Mood normal.        Behavior: Behavior normal.      Office Visit from 04/14/2020 in [place]  PHQ-2 Total Score 0     Ekg: nsr with no change from 2014    Assessment/plan: 1. Chronic fatigue syndrome ekg no change from 2014. No change from last appointment. Has been worked up. Offered CBT and she declined. Continue exercise/daily schedule.  - EKG 12-Lead  2. Pessary maintenance  - Ambulatory referral to Gynecology  3. Elevated  serum creatinine  - COMPLETE METABOLIC PANEL WITH GFR; Future  4. Dyslipidemia Declines statin therapy. Understands risks.   5. High dose flu shot    This visit occurred during the SARS-CoV-2 public health emergency.  Safety protocols were in place, including screening questions prior to the visit, additional usage of staff PPE, and extensive cleaning of exam room while observing appropriate contact time as indicated for disinfecting solutions.     Return in about 6 months (around 10/13/2020) for routine f/u and labs .   Orma Flaming, MD [place]   04/14/2020

## 2020-04-14 NOTE — Patient Instructions (Addendum)
1) referral done to new gynecologist, Dr. Talbert Nan. They will call you with this appointment.   2) your cholesterol is very high, I know you do not want to take medication, but know your risk is increased for heart attack/stroke.   3) rechecking labs today for your creatinine, which is part of kidney function.   4) ekg looks good.   5) see you back in 6 months for regular follow up.   Good to see you! Dr. Rogers Blocker

## 2020-04-15 LAB — COMPLETE METABOLIC PANEL WITH GFR
AG Ratio: 1.7 (calc) (ref 1.0–2.5)
ALT: 14 U/L (ref 6–29)
AST: 20 U/L (ref 10–35)
Albumin: 4 g/dL (ref 3.6–5.1)
Alkaline phosphatase (APISO): 70 U/L (ref 37–153)
BUN/Creatinine Ratio: 14 (calc) (ref 6–22)
BUN: 17 mg/dL (ref 7–25)
CO2: 26 mmol/L (ref 20–32)
Calcium: 10.1 mg/dL (ref 8.6–10.4)
Chloride: 107 mmol/L (ref 98–110)
Creat: 1.18 mg/dL — ABNORMAL HIGH (ref 0.60–0.88)
GFR, Est African American: 50 mL/min/{1.73_m2} — ABNORMAL LOW (ref >=60)
GFR, Est Non African American: 44 mL/min/{1.73_m2} — ABNORMAL LOW (ref >=60)
Globulin: 2.4 g/dL (calc) (ref 1.9–3.7)
Glucose, Bld: 85 mg/dL (ref 65–99)
Potassium: 4.1 mmol/L (ref 3.5–5.3)
Sodium: 143 mmol/L (ref 135–146)
Total Bilirubin: 0.3 mg/dL (ref 0.2–1.2)
Total Protein: 6.4 g/dL (ref 6.1–8.1)

## 2020-04-23 ENCOUNTER — Ambulatory Visit: Payer: Medicare Other | Attending: Internal Medicine

## 2020-04-23 DIAGNOSIS — Z23 Encounter for immunization: Secondary | ICD-10-CM

## 2020-04-23 NOTE — Progress Notes (Signed)
   Covid-19 Vaccination Clinic  Name:  THRESA DOZIER    MRN: [id number] DOB: 12-20-1940  04/23/2020  Ms. Sorce was observed post Covid-19 immunization for 15 minutes without incident. She was provided with Vaccine Information Sheet and instruction to access the V-Safe system.   Ms. Uram was instructed to call 911 with any severe reactions post vaccine: Marland Kitchen Difficulty breathing  . Swelling of face and throat  . A fast heartbeat  . A bad rash all over body  . Dizziness and weakness

## 2020-05-11 ENCOUNTER — Encounter: Payer: Self-pay | Admitting: Obstetrics and Gynecology

## 2020-05-11 ENCOUNTER — Ambulatory Visit (INDEPENDENT_AMBULATORY_CARE_PROVIDER_SITE_OTHER): Payer: Medicare Other | Admitting: Obstetrics and Gynecology

## 2020-05-11 ENCOUNTER — Other Ambulatory Visit: Payer: Self-pay

## 2020-05-11 VITALS — BP 136/62 | HR 97 | Ht 63.5 in | Wt 138.0 lb

## 2020-05-11 DIAGNOSIS — Z4689 Encounter for fitting and adjustment of other specified devices: Secondary | ICD-10-CM

## 2020-05-11 DIAGNOSIS — N819 Female genital prolapse, unspecified: Secondary | ICD-10-CM

## 2020-05-11 DIAGNOSIS — N952 Postmenopausal atrophic vaginitis: Secondary | ICD-10-CM | POA: Diagnosis not present

## 2020-05-11 NOTE — Progress Notes (Signed)
79 y.o. G61P2002 Widowed White or Caucasian Not Hispanic or Latino female here for pessary maintenance. Patient states that she has had her pessary since 2017 She has had it check every 5 months in the past.    Her last pessary check was in 1/21, her provider left.  She is comfortable with the pessary, no vaginal bleeding, nor vaginal discharge. She uses 1 gram of estrace cream every other week. No bowel of bladder c/o. No incontinence.   Not sexually active, Widowed x 30 years.     No LMP recorded. Patient is postmenopausal.          Sexually active: No.  The current method of family planning is post menopausal status.    Exercising: Yes.    Water aerobics  Smoker:  no  Health Maintenance: Pap:  2014 WNL  History of abnormal Pap:  no MMG:  11/01/2016 density C Bi-rads 1 neg  BMD:   2013 osteopenia Colonoscopy: 2016  TDaP:  Due  Gardasil: NA   reports that she has never smoked. She has never used smokeless tobacco. She reports current alcohol use. She reports that she does not use drugs.  Past Medical History:  Diagnosis Date  . Colon polyps 02/26/2012  . Deafness, partial   . Diverticulosis 02/26/2012  . HLD (hyperlipidemia)   . OA (osteoarthritis)   . Rheumatic fever   . Sleep apnea, does not tolerate CPAP     Past Surgical History:  Procedure Laterality Date  . BREAST EXCISIONAL BIOPSY Left 1990   BENIGN  . COLONOSCOPY  2003, 2013  . CYSTOCELE REPAIR  04/2011  . TONSILLECTOMY  1944    Current Outpatient Medications  Medication Sig Dispense Refill  . Biotin 5000 MCG TABS Take 5,000 mcg by mouth daily.    . Calcium Carbonate (CALCIUM 600 PO) Take 600 mg by mouth daily.    . Cholecalciferol (VITAMIN D) 1000 UNITS capsule Take 5,000 Units by mouth daily.     . COLLAGEN PO Take 40 mg by mouth at bedtime.    Marland Kitchen estradiol (ESTRACE VAGINAL) 0.1 MG/GM vaginal cream Place 1 Applicatorful vaginally. Every 10 days    . Magnesium 125 MG CAPS Take 125 mg by mouth in the morning  and at bedtime.    . Multiple Vitamin (MULTIVITAMIN) tablet Take 1 tablet by mouth daily.      . PSYLLIUM HUSK PO Take 610 mg by mouth daily.    . Sodium Hyaluronate, oral, (HYALURONIC ACID PO) Take 166 mg by mouth daily.    . vitamin B-12 (CYANOCOBALAMIN) 1000 MCG tablet Take 1,000 mcg by mouth daily.     No current facility-administered medications for this visit.    Family History  Problem Relation Age of Onset  . Heart disease Mother   . Colitis Mother   . Colon cancer Neg Hx   . Stomach cancer Neg Hx   . Colon polyps Neg Hx   . Esophageal cancer Neg Hx   . Diabetes Neg Hx   . Kidney disease Neg Hx     Review of Systems  All other systems reviewed and are negative.   Exam:   BP 136/62   Pulse 97   Ht 5' 3.5" (1.613 m)   Wt 138 lb (62.6 kg)   SpO2 97%   BMI 24.06 kg/m   Weight change: @WEIGHTCHANGE @ Height:   Height: 5' 3.5" (161.3 cm)  Ht Readings from Last 3 Encounters:  05/11/20 5' 3.5" (1.613 m)  04/14/20 5'  4" (1.626 m)  01/29/20 5\' 4"  (1.626 m)    General appearance: alert, cooperative and appears stated age   Pelvic: External genitalia:  no lesions              Urethra:  normal appearing urethra with no masses, tenderness or lesions              Bartholins and Skenes: normal                 Vagina: the pessary (ring, think size 2)  was removed and cleaned, small laceration in the posterior fourchette with removal. Very mildly atrophic vaginal mucosa, no vaginal irritation. The pessary was replaced. Mild cystocele and uterine prolapse noted with valsalva (poor valsalva), only examined supine.                Cervix: no lesions               Bimanual Exam:  Uterus:  normal size, contour, position, consistency, mobility, non-tender              Adnexa: no mass, fullness, tenderness               Rectovaginal: Confirms               Anus:  normal sphincter tone, no lesions  Gae Dry chaperoned for the exam.  A:  Mild genital prolapse, well controlled  with small ring pessary  P:   Pessary replaced  Continue with vaginal estrogen a couple of times a month  F/U in 6 months

## 2020-05-11 NOTE — Patient Instructions (Signed)
Pelvic Organ Prolapse Pelvic organ prolapse is the stretching, bulging, or dropping of pelvic organs into an abnormal position. It happens when the muscles and tissues that surround and support pelvic structures become weak or stretched. Pelvic organ prolapse can involve the:  Vagina (vaginal prolapse).  Uterus (uterine prolapse).  Bladder (cystocele).  Rectum (rectocele).  Intestines (enterocele). When organs other than the vagina are involved, they often bulge into the vagina or protrude from the vagina, depending on how severe the prolapse is. What are the causes? This condition may be caused by:  Pregnancy, labor, and childbirth.  Past pelvic surgery.  Decreased production of the hormone estrogen associated with menopause.  Consistently lifting more than 50 lb (23 kg).  Obesity.  Long-term inability to pass stool (chronic constipation).  A cough that lasts a long time (chronic).  Buildup of fluid in the abdomen due to certain diseases and other conditions. What are the signs or symptoms? Symptoms of this condition include:  Passing a little urine (loss of bladder control) when you cough, sneeze, strain, and exercise (stress incontinence). This may be worse immediately after childbirth. It may gradually improve over time.  Feeling pressure in your pelvis or vagina. This pressure may increase when you cough or when you are passing stool.  A bulge that protrudes from the opening of your vagina.  Difficulty passing urine or stool.  Pain in your lower back.  Pain, discomfort, or disinterest in sex.  Repeated bladder infections (urinary tract infections).  Difficulty inserting a tampon. In some people, this condition causes no symptoms. How is this diagnosed? This condition may be diagnosed based on a vaginal and rectal exam. During the exam, you may be asked to cough and strain while you are lying down, sitting, and standing up. Your health care provider will  determine if other tests are required, such as bladder function tests. How is this treated? Treatment for this condition may depend on your symptoms. Treatment may include:  Lifestyle changes, such as changes to your diet.  Emptying your bladder at scheduled times (bladder training therapy). This can help reduce or avoid urinary incontinence.  Estrogen. Estrogen may help mild prolapse by increasing the strength and tone of pelvic floor muscles.  Kegel exercises. These may help mild cases of prolapse by strengthening and tightening the muscles of the pelvic floor.  A soft, flexible device that helps support the vaginal walls and keep pelvic organs in place (pessary). This is inserted into your vagina by your health care provider.  Surgery. This is often the only form of treatment for severe prolapse. Follow these instructions at home:  Avoid drinking beverages that contain caffeine or alcohol.  Increase your intake of high-fiber foods. This can help decrease constipation and straining during bowel movements.  Lose weight if recommended by your health care provider.  Wear a sanitary pad or adult diapers if you have urinary incontinence.  Avoid heavy lifting and straining with exercise and work. Do not hold your breath when you perform mild to moderate lifting and exercise activities. Limit your activities as directed by your health care provider.  Do Kegel exercises as directed by your health care provider. To do this: ? Squeeze your pelvic floor muscles tight. You should feel a tight lift in your rectal area and a tightness in your vaginal area. Keep your stomach, buttocks, and legs relaxed. ? Hold the muscles tight for up to 10 seconds. ? Relax your muscles. ? Repeat this exercise 50 times a day,   or as many times as told by your health care provider. Continue to do this exercise for at least 4-6 weeks, or for as long as told by your health care provider.  Take over-the-counter and  prescription medicines only as told by your health care provider.  If you have a pessary, take care of it as told by your health care provider.  Keep all follow-up visits as told by your health care provider. This is important. Contact a health care provider if you:  Have symptoms that interfere with your daily activities or sex life.  Need medicine to help with the discomfort.  Notice bleeding from your vagina that is not related to your period.  Have a fever.  Have pain or bleeding when you urinate.  Have bleeding when you pass stool.  Pass urine when you have sex.  Have chronic constipation.  Have a pessary that falls out.  Have bad smelling vaginal discharge.  Have an unusual, low pain in your abdomen. Summary  Pelvic organ prolapse is the stretching, bulging, or dropping of pelvic organs into an abnormal position. It happens when the muscles and tissues that surround and support pelvic structures become weak or stretched.  When organs other than the vagina are involved, they often bulge into the vagina or protrude from the vagina, depending on how severe the prolapse is.  In most cases, this condition needs to be treated only if it produces symptoms. Treatment may include lifestyle changes, estrogen, Kegel exercises, pessary insertion, or surgery.  Avoid heavy lifting and straining with exercise and work. Do not hold your breath when you perform mild to moderate lifting and exercise activities. Limit your activities as directed by your health care provider. This information is not intended to replace advice given to you by your health care provider. Make sure you discuss any questions you have with your health care provider. Document Revised: 07/17/2017 Document Reviewed: 07/17/2017 Elsevier Patient Education  2020 [employer]  

## 2020-08-05 DIAGNOSIS — H52203 Unspecified astigmatism, bilateral: Secondary | ICD-10-CM | POA: Diagnosis not present

## 2020-08-05 DIAGNOSIS — H2513 Age-related nuclear cataract, bilateral: Secondary | ICD-10-CM | POA: Diagnosis not present

## 2020-08-15 ENCOUNTER — Encounter: Payer: Self-pay | Admitting: Family Medicine

## 2020-08-22 ENCOUNTER — Ambulatory Visit (INDEPENDENT_AMBULATORY_CARE_PROVIDER_SITE_OTHER): Payer: Medicare Other | Admitting: Family Medicine

## 2020-08-22 ENCOUNTER — Other Ambulatory Visit: Payer: Self-pay

## 2020-08-22 ENCOUNTER — Encounter: Payer: Self-pay | Admitting: Family Medicine

## 2020-08-22 VITALS — BP 118/60 | HR 99 | Temp 97.9°F | Ht 63.5 in | Wt 136.2 lb

## 2020-08-22 DIAGNOSIS — R7989 Other specified abnormal findings of blood chemistry: Secondary | ICD-10-CM

## 2020-08-22 DIAGNOSIS — G9332 Myalgic encephalomyelitis/chronic fatigue syndrome: Secondary | ICD-10-CM

## 2020-08-22 DIAGNOSIS — R5382 Chronic fatigue, unspecified: Secondary | ICD-10-CM | POA: Diagnosis not present

## 2020-08-22 LAB — COMPREHENSIVE METABOLIC PANEL
ALT: 14 U/L (ref 0–35)
AST: 19 U/L (ref 0–37)
Albumin: 4 g/dL (ref 3.5–5.2)
Alkaline Phosphatase: 70 U/L (ref 39–117)
BUN: 14 mg/dL (ref 6–23)
CO2: 29 mEq/L (ref 19–32)
Calcium: 10 mg/dL (ref 8.4–10.5)
Chloride: 107 mEq/L (ref 96–112)
Creatinine, Ser: 1.19 mg/dL (ref 0.40–1.20)
GFR: 43.12 mL/min — ABNORMAL LOW (ref >60.00)
Glucose, Bld: 84 mg/dL (ref 70–99)
Potassium: 4.1 mEq/L (ref 3.5–5.1)
Sodium: 141 mEq/L (ref 135–145)
Total Bilirubin: 0.4 mg/dL (ref 0.2–1.2)
Total Protein: 7 g/dL (ref 6.0–8.3)

## 2020-08-22 LAB — CBC WITH DIFFERENTIAL/PLATELET
Basophils Absolute: 0.1 10*3/uL (ref 0.0–0.1)
Basophils Relative: 1.3 % (ref 0.0–3.0)
Eosinophils Absolute: 0.1 10*3/uL (ref 0.0–0.7)
Eosinophils Relative: 2 % (ref 0.0–5.0)
HCT: 41.2 % (ref 36.0–46.0)
Hemoglobin: 14 g/dL (ref 12.0–15.0)
Lymphocytes Relative: 35 % (ref 12.0–46.0)
Lymphs Abs: 2 10*3/uL (ref 0.7–4.0)
MCHC: 33.9 g/dL (ref 30.0–36.0)
MCV: 89.6 fl (ref 78.0–100.0)
Monocytes Absolute: 0.4 10*3/uL (ref 0.1–1.0)
Monocytes Relative: 6.1 % (ref 3.0–12.0)
Neutro Abs: 3.2 10*3/uL (ref 1.4–7.7)
Neutrophils Relative %: 55.6 % (ref 43.0–77.0)
Platelets: 264 10*3/uL (ref 150.0–400.0)
RBC: 4.6 Mil/uL (ref 3.87–5.11)
RDW: 13.7 % (ref 11.5–15.5)
WBC: 5.8 10*3/uL (ref 4.0–10.5)

## 2020-08-22 LAB — SEDIMENTATION RATE: Sed Rate: 18 mm/hr (ref 0–30)

## 2020-08-22 LAB — C-REACTIVE PROTEIN: CRP: <1 mg/dL (ref 0.5–20.0)

## 2020-08-22 NOTE — Patient Instructions (Signed)
1) chests xray and labs today 2) ordered a cardiac stress test, they will call you with this.  3) insurance will not cover mri of head at this time. You also have a normal neuro exam which is reassuring.   Other thoughts are starting a SSRI type drug for chronic fatigue.   Good seeing you. F/u in July for fasting labs.   Dr. Rogers Blocker

## 2020-08-22 NOTE — Progress Notes (Signed)
Patient: Tina Smith MRN: [id number] DOB: [birth year] PCP: Orma Flaming, MD     Subjective:  Chief Complaint  Patient presents with  . Follow-up    Fatigue; Pt says that she is not feeling better. In fact she says that it has worsened since her last visit. She denies dizziness, headache, nausea, or vomiting.    HPI: The patient is a 80 y.o. female who presents today to follow up on fatigue. 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 She is sleeping about 9 hours/night. She gets up every 4-5 hours to urinate, but goes back to sleep. She is still doing water aerobics, but no other exercise. I did have her look up fibromyalgia and she doesn't think she has this. She just had her eyes checked and they are normal.   She has had a cardiac work up in the past, but it's been a while. She has never smoked and has no shortness of breath. She denies any chest pain or palpitations. She had a stress test about 10 years ago. She did have rhuematic fever as a child. She has no weight loss, night sweats or fevers. Denies any blood in stool.  Last colonoscopy was in 2016 and she was told no more at that time.  She also has a handicap form as I denied this last time.   Review of Systems  Constitutional: Positive for fatigue. Negative for chills and fever.  HENT: Negative for dental problem, ear pain, hearing loss and trouble swallowing.   Eyes: Negative for visual disturbance.  Respiratory: Negative for cough, chest tightness and shortness of breath.   Cardiovascular: Negative for chest pain, palpitations and leg swelling.  Gastrointestinal: Negative for abdominal pain, blood in stool, diarrhea, nausea and vomiting.  Endocrine: Negative for cold intolerance, polydipsia, polyphagia and polyuria.  Genitourinary: Negative for dysuria and hematuria.  Musculoskeletal: Negative for arthralgias.  Skin: Negative for rash.  Neurological: Negative  for dizziness and headaches.  Psychiatric/Behavioral: Negative for dysphoric mood and sleep disturbance. The patient is not nervous/anxious.     Allergies Patient has No Known Allergies.  Past Medical History Patient  has a past medical history of Colon polyps (02/26/2012), Deafness, partial, Diverticulosis (02/26/2012), HLD (hyperlipidemia), OA (osteoarthritis), Rheumatic fever, and Sleep apnea, does not tolerate CPAP.  Surgical History Patient  has a past surgical history that includes Tonsillectomy (1944); Colonoscopy (2003, 2013); Cystocele repair (04/2011); and Breast excisional biopsy (Left, 1990).  Family History Pateint's family history includes Colitis in her mother; Heart disease in her mother.  Social History Patient  reports that she has never smoked. She has never used smokeless tobacco. She reports current alcohol use. She reports that she does not use drugs.    Objective: Vitals:   08/22/20 1100  BP: 118/60  Pulse: 99  Temp: 97.9 F (36.6 C)  TempSrc: Temporal  SpO2: 95%  Weight: 136 lb 3.2 oz (61.8 kg)  Height: 5' 3.5" (1.613 m)    Body mass index is 23.75 kg/m.  Physical Exam Vitals reviewed.  Constitutional:      Appearance: Normal appearance. She is normal weight.  HENT:     Head: Normocephalic and atraumatic.     Right Ear: External ear normal. There is impacted cerumen.     Left Ear: Tympanic membrane, ear canal and external ear normal.     Nose: Nose normal.     Mouth/Throat:     Mouth:  Mucous membranes are moist.  Eyes:     Extraocular Movements: Extraocular movements intact.     Conjunctiva/sclera: Conjunctivae normal.     Pupils: Pupils are equal, round, and reactive to light.  Neck:     Vascular: No carotid bruit.  Cardiovascular:     Rate and Rhythm: Normal rate and regular rhythm.     Heart sounds: Normal heart sounds.  Pulmonary:     Effort: Pulmonary effort is normal.     Breath sounds: Normal breath sounds.  Abdominal:      General: Abdomen is flat. Bowel sounds are normal.     Palpations: Abdomen is soft.  Musculoskeletal:     Cervical back: Normal range of motion and neck supple.  Lymphadenopathy:     Cervical: No cervical adenopathy.  Skin:    General: Skin is warm.     Capillary Refill: Capillary refill takes less than 2 seconds.  Neurological:     General: No focal deficit present.     Mental Status: She is alert and oriented to person, place, and time.     Cranial Nerves: No cranial nerve deficit.     Sensory: No sensory deficit.     Motor: No weakness.     Coordination: Coordination normal.     Gait: Gait normal.     Deep Tendon Reflexes: Reflexes normal.  Psychiatric:        Mood and Affect: Mood normal.        Behavior: Behavior normal.        Assessment/plan: 1. Chronic fatigue syndrome Has been a chronic issue for years (since 63s). Will check inflammatory markers, CXR and schedule and exercise stress test. She asked about imaging of her brain, but she has no other symptoms of MS with normal neuro exam. Medicare denies MRI when I tried to order. Will start with the above and go from there. Also recommended SSRI, but she declined.  - CBC with Differential/Platelet - C-reactive protein - Sedimentation rate - DG Chest 2 View; Future - Exercise Tolerance Test; Future - Cardiac Stress Test: Informed Consent Details: Physician/Practitioner Attestation; Transcribe to consent form and obtain patient signature  2. Elevated serum creatinine  - Comprehensive metabolic panel  This visit occurred during the SARS-CoV-2 public health emergency.  Safety protocols were in place, including screening questions prior to the visit, additional usage of staff PPE, and extensive cleaning of exam room while observing appropriate contact time as indicated for disinfecting solutions.    Return in about 6 months (around 02/19/2021) for hyperlipidemia/fatigue .   Orma Flaming, MD [place]    08/22/2020

## 2020-08-30 ENCOUNTER — Other Ambulatory Visit: Payer: Self-pay

## 2020-08-30 ENCOUNTER — Ambulatory Visit (INDEPENDENT_AMBULATORY_CARE_PROVIDER_SITE_OTHER): Payer: Medicare Other

## 2020-08-30 ENCOUNTER — Other Ambulatory Visit: Payer: Medicare Other

## 2020-08-30 DIAGNOSIS — R5382 Chronic fatigue, unspecified: Secondary | ICD-10-CM

## 2020-08-30 DIAGNOSIS — G9332 Myalgic encephalomyelitis/chronic fatigue syndrome: Secondary | ICD-10-CM

## 2020-08-30 DIAGNOSIS — R5383 Other fatigue: Secondary | ICD-10-CM | POA: Diagnosis not present

## 2020-10-05 ENCOUNTER — Other Ambulatory Visit: Payer: Self-pay

## 2020-10-05 MED ORDER — NONFORMULARY OR COMPOUNDED ITEM: 0 refills | Status: DC

## 2020-10-05 NOTE — Telephone Encounter (Addendum)
Received refill request for Estradiol 0.02% vag cream s: one gram vaginally once a week  from [facility].  It is noted on Rx that "originally prescribed by Janyth Pupa". Dr. Talbert Nan wrote she was using Estrace Vag cream every other week at her 11/21 visit.  I called patient to clarify what she is using and how. She said she has been using the compounded Estradiol 0.02% for the last five years. She does use if every other week.    Ok to refill ( I revised directions to match how she is using it.)

## 2020-10-05 NOTE — Telephone Encounter (Signed)
Please advise the patient that if she is on estrogen she should be getting mammograms.  I refilled her estrogen cream.

## 2020-10-06 NOTE — Telephone Encounter (Signed)
I spoke with patient and advised her regarding need for yearly mammo. She said she thought she had "aged out of that". I told her it is recommended especially when using estrogen as there is an increased risk for cancer.  She said she will call and schedule appointment. Informed Rx has been sent.

## 2020-10-06 NOTE — Telephone Encounter (Signed)
Called Rx in to [place].  Left message for patient to call me.

## 2020-10-21 ENCOUNTER — Other Ambulatory Visit: Payer: Self-pay

## 2020-10-21 ENCOUNTER — Ambulatory Visit: Payer: Medicare Other | Attending: Internal Medicine

## 2020-10-21 DIAGNOSIS — Z23 Encounter for immunization: Secondary | ICD-10-CM

## 2020-10-21 NOTE — Progress Notes (Signed)
   Covid-19 Vaccination Clinic  Name:  Tina Smith    MRN: [id number] DOB: March 12, 1941  10/21/2020  [name] was observed post Covid-19 immunization for 15 minutes without incident. She was provided with Vaccine Information Sheet and instruction to access the V-Safe system.   [name] was instructed to call 911 with any severe reactions post vaccine: Marland Kitchen Difficulty breathing  . Swelling of face and throat  . A fast heartbeat  . A bad rash all over body  . Dizziness and weakness   Immunizations Administered    Name Date Dose VIS Date Route   PFIZER Comrnaty(Gray TOP) Covid-19 Vaccine 10/21/2020 11:59 AM 0.3 mL 06/16/2020 Intramuscular   Manufacturer: [employer]   Lot: [id number]   ND[place]: [phone]

## 2020-10-24 ENCOUNTER — Other Ambulatory Visit (HOSPITAL_BASED_OUTPATIENT_CLINIC_OR_DEPARTMENT_OTHER): Payer: Self-pay

## 2020-10-24 MED ORDER — COVID-19 MRNA VACCINE (PFIZER) 30 MCG/0.3ML IM SUSP
INTRAMUSCULAR | 0 refills | Status: DC
  Filled 2020-10-24: qty 0.3, 1d supply, fill #0

## 2020-11-10 ENCOUNTER — Encounter: Payer: Self-pay | Admitting: Obstetrics and Gynecology

## 2020-11-10 ENCOUNTER — Other Ambulatory Visit: Payer: Self-pay

## 2020-11-10 ENCOUNTER — Ambulatory Visit (INDEPENDENT_AMBULATORY_CARE_PROVIDER_SITE_OTHER): Payer: Medicare Other | Admitting: Obstetrics and Gynecology

## 2020-11-10 VITALS — BP 134/62 | HR 76 | Wt 133.0 lb

## 2020-11-10 DIAGNOSIS — N183 Chronic kidney disease, stage 3 unspecified: Secondary | ICD-10-CM | POA: Insufficient documentation

## 2020-11-10 DIAGNOSIS — N952 Postmenopausal atrophic vaginitis: Secondary | ICD-10-CM | POA: Diagnosis not present

## 2020-11-10 DIAGNOSIS — R5382 Chronic fatigue, unspecified: Secondary | ICD-10-CM | POA: Insufficient documentation

## 2020-11-10 DIAGNOSIS — E785 Hyperlipidemia, unspecified: Secondary | ICD-10-CM | POA: Insufficient documentation

## 2020-11-10 DIAGNOSIS — Z4689 Encounter for fitting and adjustment of other specified devices: Secondary | ICD-10-CM

## 2020-11-10 DIAGNOSIS — M810 Age-related osteoporosis without current pathological fracture: Secondary | ICD-10-CM | POA: Insufficient documentation

## 2020-11-10 DIAGNOSIS — N819 Female genital prolapse, unspecified: Secondary | ICD-10-CM

## 2020-11-10 DIAGNOSIS — G9332 Myalgic encephalomyelitis/chronic fatigue syndrome: Secondary | ICD-10-CM | POA: Insufficient documentation

## 2020-11-10 NOTE — Progress Notes (Signed)
GYNECOLOGY  VISIT   HPI: 80 y.o.   Widowed White or Caucasian Not Hispanic or Latino  female   [phone] with No LMP recorded. Patient is postmenopausal.   here for a pessary check, she comes every 6 months. She uses the estrace cream every other week. No vaginal bleeding, no vaginal discharge, no discomfort. No bowel or bladder c/o.       GYNECOLOGIC HISTORY: No LMP recorded. Patient is postmenopausal. Contraception:pmp Menopausal hormone therapy: yes, estrace        OB History    Gravida  2   Para  2   Term  2   Preterm      AB      Living  2     SAB      IAB      Ectopic      Multiple      Live Births  2              Patient Active Problem List   Diagnosis Date Noted  . H/O: rheumatic fever 01/29/2020  . Uterine prolapse 01/29/2020  . Osteopenia 02/03/2013  . History of colonic polyps 03/05/2012  . Dyslipidemia 07/05/2010  . Osteoarthritis 07/05/2010    Past Medical History:  Diagnosis Date  . Colon polyps 02/26/2012  . Deafness, partial   . Diverticulosis 02/26/2012  . HLD (hyperlipidemia)   . OA (osteoarthritis)   . Rheumatic fever   . Sleep apnea, does not tolerate CPAP     Past Surgical History:  Procedure Laterality Date  . BREAST EXCISIONAL BIOPSY Left 1990   BENIGN  . COLONOSCOPY  2003, 2013  . CYSTOCELE REPAIR  04/2011  . TONSILLECTOMY  1944    Current Outpatient Medications  Medication Sig Dispense Refill  . Biotin 5000 MCG TABS Take 5,000 mcg by mouth daily.    . Calcium Carbonate (CALCIUM 600 PO) Take 600 mg by mouth daily.    . Cholecalciferol (VITAMIN D) 1000 UNITS capsule Take 5,000 Units by mouth daily.    . COLLAGEN PO Take 40 mg by mouth at bedtime.    Marland Kitchen COVID-19 mRNA vaccine, Pfizer, 30 MCG/0.3ML injection Inject into the muscle. 0.3 mL 0  . estradiol (ESTRACE VAGINAL) 0.1 MG/GM vaginal cream Place 1 Applicatorful vaginally. Every 10 days    . Magnesium 125 MG CAPS Take 125 mg by mouth in the morning and at bedtime.     . Multiple Vitamin (MULTIVITAMIN) tablet Take 1 tablet by mouth daily.    . NONFORMULARY OR COMPOUNDED ITEM Estradiol Vaginal Cream 0.02% S: insert one gram (4 clicks) vaginally every other week 24 each 0  . PSYLLIUM HUSK PO Take 610 mg by mouth daily.    . Sodium Hyaluronate, oral, (HYALURONIC ACID PO) Take 166 mg by mouth daily.    . vitamin B-12 (CYANOCOBALAMIN) 1000 MCG tablet Take 1,000 mcg by mouth daily.     No current facility-administered medications for this visit.     ALLERGIES: Patient has no known allergies.  Family History  Problem Relation Age of Onset  . Heart disease Mother   . Colitis Mother   . Colon cancer Neg Hx   . Stomach cancer Neg Hx   . Colon polyps Neg Hx   . Esophageal cancer Neg Hx   . Diabetes Neg Hx   . Kidney disease Neg Hx     Social History   Socioeconomic History  . Marital status: Widowed    Spouse name: Not on file  .  Number of children: 2  . Years of education: Not on file  . Highest education level: Not on file  Occupational History    Employer: RETIRED  Tobacco Use  . Smoking status: Never Smoker  . Smokeless tobacco: Never Used  Vaping Use  . Vaping Use: Never used  Substance and Sexual Activity  . Alcohol use: Yes    Comment: occasional  . Drug use: No  . Sexual activity: Not Currently    Birth control/protection: Post-menopausal  Other Topics Concern  . Not on file  Social History Narrative   Widowed, 1 son and 1 daughter   Retired   No caffeine   Social Determinants of Radio broadcast assistant Strain: [place]   . Difficulty of Paying Living Expenses: Not hard at all  Food Insecurity: No Food Insecurity  . Worried About Charity fundraiser in the Last Year: Never true  . Ran Out of Food in the Last Year: Never true  Transportation Needs: No Transportation Needs  . Lack of Transportation (Medical): No  . Lack of Transportation (Non-Medical): No  Physical Activity: Inactive  . Days of Exercise per Week: 0  days  . Minutes of Exercise per Session: 0 min  Stress: No Stress Concern Present  . Feeling of Stress : Not at all  Social Connections: Moderately Isolated  . Frequency of Communication with Friends and Family: Twice a week  . Frequency of Social Gatherings with Friends and Family: Three times a week  . Attends Religious Services: More than 4 times per year  . Active Member of Clubs or Organizations: No  . Attends Archivist Meetings: Never  . Marital Status: Widowed  Intimate Partner Violence: Not At Risk  . Fear of Current or Ex-Partner: No  . Emotionally Abused: No  . Physically Abused: No  . Sexually Abused: No    Review of Systems  All other systems reviewed and are negative.   PHYSICAL EXAMINATION:    There were no vitals taken for this visit.    General appearance: alert, cooperative and appears stated age  Pelvic: External genitalia:  no lesions              Urethra:  normal appearing urethra with no masses, tenderness or lesions              Bartholins and Skenes: normal                 Vagina: the pessary was removed and cleaned. Atrophic vaginal mucosa without any irritation or erythema.               Cervix: no lesions               Chaperone was present for exam.  1. Pessary maintenance Doing well F/U in 6 months for breast and pelvic exam with pessary check She is overdue for a mammogram, she will call to schedule  2. Vaginal atrophy Continue with estrace cream every other week, working well

## 2020-12-29 ENCOUNTER — Other Ambulatory Visit: Payer: Self-pay | Admitting: Obstetrics and Gynecology

## 2020-12-29 DIAGNOSIS — Z1231 Encounter for screening mammogram for malignant neoplasm of breast: Secondary | ICD-10-CM

## 2021-01-05 ENCOUNTER — Other Ambulatory Visit: Payer: Self-pay | Admitting: Obstetrics & Gynecology

## 2021-01-05 ENCOUNTER — Other Ambulatory Visit: Payer: Self-pay | Admitting: Obstetrics and Gynecology

## 2021-01-05 ENCOUNTER — Inpatient Hospital Stay: Admission: RE | Admit: 2021-01-05 | Payer: BC Managed Care – PPO | Source: Ambulatory Visit

## 2021-01-05 DIAGNOSIS — Z09 Encounter for follow-up examination after completed treatment for conditions other than malignant neoplasm: Secondary | ICD-10-CM

## 2021-01-06 DIAGNOSIS — Z20822 Contact with and (suspected) exposure to covid-19: Secondary | ICD-10-CM | POA: Diagnosis not present

## 2021-01-24 ENCOUNTER — Ambulatory Visit: Payer: BC Managed Care – PPO

## 2021-01-24 ENCOUNTER — Ambulatory Visit
Admission: RE | Admit: 2021-01-24 | Discharge: 2021-01-24 | Disposition: A | Discharge status: Home / Self Care | Payer: Medicare Other | Source: Ambulatory Visit | Attending: Obstetrics and Gynecology | Admitting: Obstetrics and Gynecology

## 2021-01-24 ENCOUNTER — Other Ambulatory Visit: Payer: Self-pay

## 2021-01-24 DIAGNOSIS — R922 Inconclusive mammogram: Secondary | ICD-10-CM | POA: Diagnosis not present

## 2021-01-24 DIAGNOSIS — Z09 Encounter for follow-up examination after completed treatment for conditions other than malignant neoplasm: Secondary | ICD-10-CM

## 2021-02-14 ENCOUNTER — Telehealth: Payer: Self-pay

## 2021-02-14 NOTE — Telephone Encounter (Signed)
Pt is aware Dr Rogers Blocker is no longer here and she stated she will find another doctor

## 2021-03-16 ENCOUNTER — Ambulatory Visit: Payer: Medicare Other

## 2021-05-01 DIAGNOSIS — H532 Diplopia: Secondary | ICD-10-CM | POA: Diagnosis not present

## 2021-05-01 DIAGNOSIS — H2513 Age-related nuclear cataract, bilateral: Secondary | ICD-10-CM | POA: Diagnosis not present

## 2021-05-01 DIAGNOSIS — H52203 Unspecified astigmatism, bilateral: Secondary | ICD-10-CM | POA: Diagnosis not present

## 2021-05-02 DIAGNOSIS — H532 Diplopia: Secondary | ICD-10-CM | POA: Diagnosis not present

## 2021-05-02 LAB — CBC AND DIFFERENTIAL
HCT: 43 (ref 36–46)
Hemoglobin: 13.9 (ref 12.0–16.0)
Neutrophils Absolute: 4168
Platelets: 323 (ref 150–399)
WBC: 6.9

## 2021-05-02 LAB — TSH: TSH: 1.88 (ref <=5.90)

## 2021-05-02 LAB — CBC: RBC: 4.75 (ref 3.87–5.11)

## 2021-05-08 ENCOUNTER — Encounter: Payer: Self-pay | Admitting: Family

## 2021-05-08 ENCOUNTER — Ambulatory Visit (INDEPENDENT_AMBULATORY_CARE_PROVIDER_SITE_OTHER): Payer: Medicare Other | Admitting: Family

## 2021-05-08 ENCOUNTER — Other Ambulatory Visit: Payer: Self-pay

## 2021-05-08 VITALS — BP 120/70 | HR 89 | Temp 97.3°F | Wt 132.2 lb

## 2021-05-08 DIAGNOSIS — G9332 Myalgic encephalomyelitis/chronic fatigue syndrome: Secondary | ICD-10-CM | POA: Diagnosis not present

## 2021-05-08 DIAGNOSIS — Z23 Encounter for immunization: Secondary | ICD-10-CM | POA: Diagnosis not present

## 2021-05-08 NOTE — Progress Notes (Signed)
   Patient ID: Tina Smith, female    DOB: [birth year], 80 y.o.   MRN: [id number]  Chief Complaint  Patient presents with   Transitions Of Care   chronic fatigue   Health Maintenance    Flu shot given in office today     Subjective:  HPI:  Fatigue Pt. reports onset: years ago. Occurs upon awakening  yes during the day; evenings. Hours of sleep: 8 Works during retired. New medications or change in current med doses no. Recent labs yes through ophthalmology office, waiting on copy of results. Exercise: yes, water aerobics 3x/week High carb/sugar diet: no Sx of depression: denies.       Objective:  Physical Exam: BP 120/70   Pulse 89   Temp (!) 97.3 F (36.3 C) (Temporal)   Wt 132 lb 3.2 oz (60 kg)   SpO2 96%   BMI 23.05 kg/m   Gen: No acute distress, resting comfortably. CV: Regular rate and rhythm with no murmurs appreciated Pulm: Normal work of breathing, clear to auscultation bilaterally with no crackles, wheezes, or rhonchi Neuro: Grossly normal, moves all extremities Psych: Normal affect and thought content      Assessment/Plan:   Problem List Items Addressed This Visit       Nervous and Auditory   Chronic fatigue syndrome - Primary    Pt reports still having on daily basis, recently had Covid19, mild sx, advised this may have aggravated her fatigue and will take longer to recover. Advised on taking a Vitamin B complex qd, increase her exercise daily, gradually, short walks, stay active with friends & family, be sure to stay hydrated and not skip meals.      Other Visit Diagnoses     Need for immunization against influenza       Relevant Orders   Flu Vaccine QUAD High Dose(Fluad) (Completed)

## 2021-05-08 NOTE — Assessment & Plan Note (Signed)
Pt reports still having on daily basis, recently had Covid19, mild sx, advised this may have aggravated her fatigue and will take longer to recover. Advised on taking a Vitamin B complex qd, increase her exercise daily, gradually, short walks, stay active with friends & family, be sure to stay hydrated and not skip meals.

## 2021-05-08 NOTE — Patient Instructions (Addendum)
It was very nice to meet you today!  As discussed, please ask your eye doctor to fax over their visit notes and lab results when you see them tomorrow. I will let you know if any additional labs are needed after I review them. OK to take an OTC Vitamin B complex daily which includes all of the B vitamins which are our energy vitamins.   PLEASE NOTE:  If you had any lab tests please let us know if you have not heard back within a few days. You may see your results on mychart before we have a chance to review them but we will give you a call once they are reviewed by [place]. If we ordered any referrals today, please let us know if you have not heard from their office within the next week.   Please try these tips to maintain a healthy lifestyle:  Eat most of your calories during the day when you are active. Eliminate processed foods including packaged sweets (pies, cakes, cookies), reduce intake of potatoes, white bread, white pasta, and white rice. Look for whole grain options, oat flour or almond flour.  Each meal should contain half fruits/vegetables, one quarter protein, and one quarter carbs (no bigger than a computer mouse).  Cut down on sweet beverages. This includes juice, soda, and sweet tea. Also watch fruit intake, though this is a healthier sweet option, it still contains natural sugar! Limit to 3 servings daily.  Drink at least 1 glass of water with each meal and aim for at least 8 glasses per day  Exercise at least 150 minutes every week.

## 2021-05-09 DIAGNOSIS — H532 Diplopia: Secondary | ICD-10-CM | POA: Diagnosis not present

## 2021-05-09 DIAGNOSIS — H2513 Age-related nuclear cataract, bilateral: Secondary | ICD-10-CM | POA: Diagnosis not present

## 2021-05-10 ENCOUNTER — Other Ambulatory Visit: Payer: Self-pay | Admitting: Ophthalmology

## 2021-05-10 DIAGNOSIS — H532 Diplopia: Secondary | ICD-10-CM

## 2021-05-15 NOTE — Progress Notes (Signed)
80 y.o. [id number] Widowed White or Caucasian Not Hispanic or Latino female here for breast and pelvic exam.    She has a h/o genital prolapse, she is doing well with her pessary. She uses 1 gram of estrace cream every other week. On occasion she takes her pessary out for a few days, she took it out a few days ago and brought it with her.  No vaginal bleeding, no abnormal d/c.   Not sexually active, widowed for over 30 years.   She had double vision a few weeks ago, has a MRI scheduled.   No LMP recorded. Patient is postmenopausal.          Sexually active: No.  The current method of family planning is post menopausal status.    Exercising: Yes.       Water exercise  Smoker:  no  Health Maintenance: Pap:  2014 WNL  History of abnormal Pap:  no MMG:  01/24/21- diag birads 1 BMD:   02/12/2012- osteopenia -2.3  Colonoscopy: 12/02/14- no f/u TDaP:  unsure  Gardasil: n/a   reports that she has never smoked. She has never used smokeless tobacco. She reports current alcohol use. She reports that she does not use drugs. No ETOH. Daughter is local, son lives near the [place]. She has 3 grandchildren and 2 great grandchildren.   Past Medical History:  Diagnosis Date   Deafness, partial    Diverticulosis 02/26/2012   Sleep apnea, does not tolerate CPAP     Past Surgical History:  Procedure Laterality Date   BREAST EXCISIONAL BIOPSY Left 1990   BENIGN   COLONOSCOPY  2003, 2013   CYSTOCELE REPAIR  04/2011   TONSILLECTOMY  1944    Current Outpatient Medications  Medication Sig Dispense Refill   Biotin 5000 MCG TABS Take 5,000 mcg by mouth daily.     Calcium Carbonate (CALCIUM 600 PO) Take 600 mg by mouth daily.     Cholecalciferol (VITAMIN D) 1000 UNITS capsule Take 5,000 Units by mouth daily.     COLLAGEN PO Take 40 mg by mouth at bedtime.     estradiol (ESTRACE VAGINAL) 0.1 MG/GM vaginal cream Place 1 Applicatorful vaginally. Every 10 days     Magnesium 125 MG CAPS Take 125 mg by  mouth in the morning and at bedtime.     Multiple Vitamin (MULTIVITAMIN) tablet Take 1 tablet by mouth daily.     PSYLLIUM HUSK PO Take 610 mg by mouth daily.     Sodium Hyaluronate, oral, (HYALURONIC ACID PO) Take 166 mg by mouth daily.     vitamin B-12 (CYANOCOBALAMIN) 1000 MCG tablet Take 1,000 mcg by mouth daily.     No current facility-administered medications for this visit.    Family History  Problem Relation Age of Onset   Heart disease Mother    Colitis Mother    Colon cancer Neg Hx    Stomach cancer Neg Hx    Colon polyps Neg Hx    Esophageal cancer Neg Hx    Diabetes Neg Hx    Kidney disease Neg Hx     Review of Systems  Exam:   BP 130/74   Pulse 71   Ht 5' 3.25" (1.607 m)   Wt 131 lb (59.4 kg)   SpO2 100%   BMI 23.02 kg/m   Weight change: @WEIGHTCHANGE @ Height:   Height: 5' 3.25" (160.7 cm)  Ht Readings from Last 3 Encounters:  05/18/21 5' 3.25" (1.607 m)  08/22/20 5'  3.5" (1.613 m)  05/11/20 5' 3.5" (1.613 m)    General appearance: alert, cooperative and appears stated age Head: Normocephalic, without obvious abnormality, atraumatic Neck: no adenopathy, supple, symmetrical, trachea midline and thyroid normal to inspection and palpation Lungs: clear to auscultation bilaterally Cardiovascular: regular rate and rhythm Breasts: normal appearance, no masses or tenderness Abdomen: soft, non-tender; non distended,  no masses,  no organomegaly Extremities: extremities normal, atraumatic, no cyanosis or edema Skin: Skin color, texture, turgor normal. No rashes or lesions Lymph nodes: Cervical, supraclavicular, and axillary nodes normal. No abnormal inguinal nodes palpated Neurologic: Grossly normal   Pelvic: External genitalia:  no lesions              Urethra:  normal appearing urethra with no masses, tenderness or lesions              Bartholins and Skenes: normal                 Vagina: mildly atrophic appearing vagina with normal color and discharge, no  lesions or irritation. Pessary placed.               Cervix: no lesions               Bimanual Exam:  Uterus:  normal size, contour, position, consistency, mobility, non-tender              Adnexa: no mass, fullness, tenderness               Rectovaginal: Confirms               Anus:  normal sphincter tone, no lesions  Karmen Bongo, RN chaperoned for the exam.  1. Gynecologic exam normal Discussed breast self awareness Mammogram UTD Colonoscopy UTD  2. Pessary maintenance Doing well  3. Vaginal atrophy Continue with the vaginal estrogen cream. She will call when she needs a refill of the estrogen cream  4. History of osteopenia -Discussed Calcium and Vit D - DG Bone Density; Future  5. Loss of height - DG Bone Density; Future  6. Postmenopausal - DG Bone Density; Future

## 2021-05-18 ENCOUNTER — Other Ambulatory Visit: Payer: Self-pay

## 2021-05-18 ENCOUNTER — Ambulatory Visit (INDEPENDENT_AMBULATORY_CARE_PROVIDER_SITE_OTHER): Payer: Medicare Other | Admitting: Obstetrics and Gynecology

## 2021-05-18 ENCOUNTER — Encounter: Payer: Self-pay | Admitting: Obstetrics and Gynecology

## 2021-05-18 VITALS — BP 130/74 | HR 71 | Ht 63.25 in | Wt 131.0 lb

## 2021-05-18 DIAGNOSIS — Z780 Asymptomatic menopausal state: Secondary | ICD-10-CM | POA: Diagnosis not present

## 2021-05-18 DIAGNOSIS — R29890 Loss of height: Secondary | ICD-10-CM

## 2021-05-18 DIAGNOSIS — Z01419 Encounter for gynecological examination (general) (routine) without abnormal findings: Secondary | ICD-10-CM

## 2021-05-18 DIAGNOSIS — Z4689 Encounter for fitting and adjustment of other specified devices: Secondary | ICD-10-CM | POA: Diagnosis not present

## 2021-05-18 DIAGNOSIS — Z8739 Personal history of other diseases of the musculoskeletal system and connective tissue: Secondary | ICD-10-CM

## 2021-05-18 DIAGNOSIS — N952 Postmenopausal atrophic vaginitis: Secondary | ICD-10-CM | POA: Diagnosis not present

## 2021-05-18 NOTE — Patient Instructions (Signed)

## 2021-05-30 ENCOUNTER — Ambulatory Visit
Admission: RE | Admit: 2021-05-30 | Discharge: 2021-05-30 | Disposition: A | Discharge status: Home / Self Care | Payer: Medicare Other | Source: Ambulatory Visit | Attending: Ophthalmology | Admitting: Ophthalmology

## 2021-05-30 ENCOUNTER — Other Ambulatory Visit: Payer: Self-pay

## 2021-05-30 DIAGNOSIS — H532 Diplopia: Secondary | ICD-10-CM

## 2021-05-30 DIAGNOSIS — G939 Disorder of brain, unspecified: Secondary | ICD-10-CM | POA: Diagnosis not present

## 2021-05-30 MED ORDER — GADOBENATE DIMEGLUMINE 529 MG/ML IV SOLN
12.0000 mL | Freq: Once | INTRAVENOUS | Status: AC | PRN
  Administered 2021-05-30: 12 mL via INTRAVENOUS

## 2021-06-13 ENCOUNTER — Encounter: Payer: Self-pay | Admitting: Family

## 2021-06-13 DIAGNOSIS — H532 Diplopia: Secondary | ICD-10-CM | POA: Diagnosis not present

## 2021-06-14 ENCOUNTER — Telehealth: Payer: Self-pay

## 2021-06-14 NOTE — Telephone Encounter (Signed)
Patient called asking if she might be able to have her BD test performed at the [facility] on [address]. THe order placed was for [facility].  If you are okay with that I will make sure that BD test are performed there and place the order.

## 2021-06-14 NOTE — Telephone Encounter (Signed)
Yes, [facility] is fine. Thanks!

## 2021-06-15 ENCOUNTER — Other Ambulatory Visit: Payer: Self-pay

## 2021-06-15 DIAGNOSIS — Z20822 Contact with and (suspected) exposure to covid-19: Secondary | ICD-10-CM | POA: Diagnosis not present

## 2021-06-15 NOTE — Telephone Encounter (Signed)
Patient informed. They have already noted that on order and have been trying to reach her. I asked her to give them a call back to schedule.

## 2021-06-20 ENCOUNTER — Other Ambulatory Visit: Payer: Self-pay

## 2021-06-20 ENCOUNTER — Ambulatory Visit: Payer: Medicare Other | Attending: Internal Medicine

## 2021-06-20 ENCOUNTER — Ambulatory Visit (HOSPITAL_BASED_OUTPATIENT_CLINIC_OR_DEPARTMENT_OTHER)
Admission: RE | Admit: 2021-06-20 | Discharge: 2021-06-20 | Disposition: A | Discharge status: Home / Self Care | Payer: Medicare Other | Source: Ambulatory Visit | Attending: Obstetrics and Gynecology | Admitting: Obstetrics and Gynecology

## 2021-06-20 ENCOUNTER — Other Ambulatory Visit (HOSPITAL_BASED_OUTPATIENT_CLINIC_OR_DEPARTMENT_OTHER): Payer: Self-pay

## 2021-06-20 DIAGNOSIS — Z8739 Personal history of other diseases of the musculoskeletal system and connective tissue: Secondary | ICD-10-CM | POA: Diagnosis not present

## 2021-06-20 DIAGNOSIS — M85832 Other specified disorders of bone density and structure, left forearm: Secondary | ICD-10-CM | POA: Diagnosis not present

## 2021-06-20 DIAGNOSIS — Z780 Asymptomatic menopausal state: Secondary | ICD-10-CM | POA: Insufficient documentation

## 2021-06-20 DIAGNOSIS — R29890 Loss of height: Secondary | ICD-10-CM | POA: Insufficient documentation

## 2021-06-20 DIAGNOSIS — M810 Age-related osteoporosis without current pathological fracture: Secondary | ICD-10-CM | POA: Diagnosis not present

## 2021-06-20 DIAGNOSIS — Z23 Encounter for immunization: Secondary | ICD-10-CM

## 2021-06-20 MED ORDER — PFIZER COVID-19 VAC BIVALENT 30 MCG/0.3ML IM SUSP
INTRAMUSCULAR | 0 refills | Status: DC
  Filled 2021-06-20: qty 0.3, 1d supply, fill #0

## 2021-06-20 NOTE — Progress Notes (Signed)
° °  Covid-19 Vaccination Clinic  Name:  Tina Smith    MRN: [id number] DOB: Nov 29, 1940  06/20/2021  [name] was observed post Covid-19 immunization for 15 minutes without incident. She was provided with Vaccine Information Sheet and instruction to access the V-Safe system.   [name] was instructed to call 911 with any severe reactions post vaccine: Difficulty breathing  Swelling of face and throat  A fast heartbeat  A bad rash all over body  Dizziness and weakness   Immunizations Administered     Name Date Dose VIS Date Route   Pfizer Covid-19 Vaccine Bivalent Booster 06/20/2021 11:24 AM 0.3 mL 03/08/2021 Intramuscular   Manufacturer: [place]   Lot: [id number]   [place]: [phone]

## 2021-08-10 ENCOUNTER — Encounter: Payer: Self-pay | Admitting: Family

## 2021-08-17 NOTE — Telephone Encounter (Signed)
..  Type of form received: Body Donation Paperwork  Additional comments: Pt wants scanned into her chart  Received by: Adonis Brook  Form should be Faxed to: NA  Form should be mailed to:  NA  Is patient requesting call for pickup: NA   Form placed:  In provider's box  Attach charge sheet. No  Individual made aware of 3-5 business day turn around (Y/N)?

## 2021-09-27 DIAGNOSIS — Z20822 Contact with and (suspected) exposure to covid-19: Secondary | ICD-10-CM | POA: Diagnosis not present

## 2021-10-12 DIAGNOSIS — Z20822 Contact with and (suspected) exposure to covid-19: Secondary | ICD-10-CM | POA: Diagnosis not present

## 2021-10-20 DIAGNOSIS — Z20828 Contact with and (suspected) exposure to other viral communicable diseases: Secondary | ICD-10-CM | POA: Diagnosis not present

## 2021-10-26 DIAGNOSIS — Z20822 Contact with and (suspected) exposure to covid-19: Secondary | ICD-10-CM | POA: Diagnosis not present

## 2021-11-02 ENCOUNTER — Telehealth: Payer: Self-pay | Admitting: Family

## 2021-11-02 NOTE — Telephone Encounter (Signed)
Patient called wanting to sch an apt with [facility] - patient refused to provide reason for visit- patient then stated she has been SOB for a month, feeling weak and feels like fainting.   Patient was advised she was being triaged. Patient agreed-   ?

## 2021-11-02 NOTE — Telephone Encounter (Signed)
Patient Name: Tina Smith ON Gender: Female DOB: 03-Jul-1941 Age: 81 Y 10 M 25 D Return Phone Number: [phone] (Primary) Address: City/ State/ Zip: [place]  [address] Client [facility] Client [facility] Contact Type Call Who Is Calling Patient / Member / Family / Caregiver Call Type Triage / Clinical Relationship To Patient Self Return Phone Number [phone] (Primary) Chief Complaint BREATHING - shortness of breath or sounds breathless Reason for Call Symptomatic / Request for [place] states she has been having shortness of breath for a month, feels weak and like she is going to pass out. Sees Jeanie Sewer, NP. Translation No Nurse Assessment Nurse: Martyn Ehrich, RN, Felicia Date/Time (Eastern Time): 11/02/2021 3:15:59 PM Confirm and document reason for call. If symptomatic, describe symptoms. ---Pt has been SOB for a week. She has chronic fatigue syndrome for 30 yrs and last Sept she had covid. Since then she feels weaker and weaker. No cough or congestion. No fever. Does the patient have any new or worsening symptoms? ---Yes Will a triage be completed? ---Yes Related visit to physician within the last 2 weeks? ---No Does the PT have any chronic conditions? (i.e. diabetes, asthma, this includes High risk factors for pregnancy, etc.) ---Yes List chronic conditions. ---See above Is this a behavioral health or substance abuse call? ---No Guidelines Guideline Title Affirmed Question Affirmed Notes Nurse Date/Time (Eastern Time) Weakness (Generalized) and Fatigue Difficulty breathing Martyn Ehrich, RN, Solmon Ice 3/84/5364 6:80:32 PM Disp. Time Eilene Ghazi Time) Disposition Final User 11/02/2021 3:13:32 PM Send to Urgent Jeannette Corpus, Tiffany   11/02/2021 3:25:03 PM Go to ED Now Yes Martyn Ehrich, RN, Alphia Kava Disagree/Comply Disagree Caller Understands  Yes PreDisposition Did not know what to do Care Advice Given Per Guideline GO TO ED NOW: * You need to be seen in the Emergency Department. * Bring the pill bottles too. This will help the doctor (or NP/PA) to make certain you are taking the right medicines and the right dose. Comments User: Daphene Calamity, RN Date/Time Eilene Ghazi Time): 11/02/2021 3:25:37 PM only has trouble breathing when she bends over User: Daphene Calamity, RN Date/Time (Eastern Time): 11/02/2021 3:28:43 PM no trouble breathing when she is sitting or walking - just when she bends over. Triaged for difficullty breathing and all questions answered with "No" User: Daphene Calamity, RN Date/Time (Eastern Time): 11/02/2021 3:30:35 PM Caller declined to go to ER - she wants someone to schedule an echo and stress test bc they were supposed to in the past. Told her will contact the office and someone should call back shortly since she is declining to go to ER User: Daphene Calamity, RN Date/Time Eilene Ghazi Time): 11/02/2021 3:32:43 PM was not able to reach the office backline User: Daphene Calamity, RN Date/Time (Eastern Time): 11/02/2021 3:34:46 PM Told caller was not able to get through to the office. Told her They may see this report sent to them and caller her. [facility] ER disposition. Referrals GO TO FACILITY REFUSED

## 2021-11-06 NOTE — Telephone Encounter (Signed)
Could not get in touch with pt, contacted daughter. Daughter will have her schedule an appointment with the office and she did not know anything about her mother being triaged.  ?

## 2021-11-16 ENCOUNTER — Ambulatory Visit: Payer: Medicare Other | Admitting: Obstetrics and Gynecology

## 2021-11-22 ENCOUNTER — Encounter: Payer: Self-pay | Admitting: Obstetrics and Gynecology

## 2021-11-22 ENCOUNTER — Ambulatory Visit (INDEPENDENT_AMBULATORY_CARE_PROVIDER_SITE_OTHER): Payer: Medicare Other | Admitting: Obstetrics and Gynecology

## 2021-11-22 VITALS — BP 122/84 | HR 63 | Wt 129.0 lb

## 2021-11-22 DIAGNOSIS — N762 Acute vulvitis: Secondary | ICD-10-CM

## 2021-11-22 DIAGNOSIS — N952 Postmenopausal atrophic vaginitis: Secondary | ICD-10-CM | POA: Diagnosis not present

## 2021-11-22 DIAGNOSIS — Z4689 Encounter for fitting and adjustment of other specified devices: Secondary | ICD-10-CM

## 2021-11-22 LAB — WET PREP FOR TRICH, YEAST, CLUE

## 2021-11-22 MED ORDER — BETAMETHASONE VALERATE 0.1 % EX OINT: TOPICAL_OINTMENT | CUTANEOUS | 0 refills | Status: DC

## 2021-11-22 MED ORDER — ESTRADIOL 0.1 MG/GM VA CREA: TOPICAL_CREAM | VAGINAL | 1 refills | Status: DC

## 2021-11-22 NOTE — Progress Notes (Signed)
GYNECOLOGY  VISIT ?  ?HPI: ?81 y.o.   Widowed White or Caucasian Not Hispanic or Latino  female   ?[id number] with No LMP recorded. Patient is postmenopausal.   ?here for  Pessary check.  ?She has a h/o genital prolapse, she is doing well with her pessary. She was using 1 gram of estrace cream every other week. Ran out a few months ago. ? ?She hasn't taken the pessary out. No vaginal bleeding.  ? ?She is having some mild, intermittent vulvar itching. Vaseline helps. No vaginal discharge.  ? ?GYNECOLOGIC HISTORY: ?No LMP recorded. Patient is postmenopausal. ?Contraception:pmp  ?Menopausal hormone therapy: none  ?       ?OB History   ? ? Gravida  ?2  ? Para  ?2  ? Term  ?2  ? Preterm  ?   ? AB  ?   ? Living  ?2  ?  ? ? SAB  ?   ? IAB  ?   ? Ectopic  ?   ? Multiple  ?   ? Live Births  ?2  ?   ?  ?  ?    ? ?Patient Active Problem List  ? Diagnosis Date Noted  ? Chronic fatigue syndrome 11/10/2020  ? Chronic kidney disease, stage 3 unspecified ([place]) 11/10/2020  ? Hyperlipidemia 11/10/2020  ? Osteoporosis 11/10/2020  ? H/O: rheumatic fever 01/29/2020  ? Uterine prolapse 01/29/2020  ? Osteopenia 02/03/2013  ? History of colonic polyps 03/05/2012  ? Dyslipidemia 07/05/2010  ? Osteoarthritis 07/05/2010  ? ? ?Past Medical History:  ?Diagnosis Date  ? Deafness, partial   ? Diverticulosis 02/26/2012  ? Sleep apnea, does not tolerate CPAP   ? ? ?Past Surgical History:  ?Procedure Laterality Date  ? BREAST EXCISIONAL BIOPSY Left 1990  ? BENIGN  ? COLONOSCOPY  2003, 2013  ? CYSTOCELE REPAIR  04/2011  ? TONSILLECTOMY  1944  ? ? ?Current Outpatient Medications  ?Medication Sig Dispense Refill  ? Calcium Carbonate (CALCIUM 600 PO) Take 600 mg by mouth daily.    ? Cholecalciferol (VITAMIN D) 1000 UNITS capsule Take 5,000 Units by mouth daily.    ? Multiple Vitamin (MULTIVITAMIN) tablet Take 1 tablet by mouth daily.    ? PSYLLIUM HUSK PO Take 610 mg by mouth daily.    ? Sodium Hyaluronate, oral, (HYALURONIC ACID PO) Take 166 mg by mouth  daily.    ? vitamin B-12 (CYANOCOBALAMIN) 1000 MCG tablet Take 1,000 mcg by mouth daily.    ? ?No current facility-administered medications for this visit.  ?  ? ?ALLERGIES: Patient has no known allergies. ? ?Family History  ?Problem Relation Age of Onset  ? Heart disease Mother   ? Colitis Mother   ? Colon cancer Neg Hx   ? Stomach cancer Neg Hx   ? Colon polyps Neg Hx   ? Esophageal cancer Neg Hx   ? Diabetes Neg Hx   ? Kidney disease Neg Hx   ? ? ?Social History  ? ?Socioeconomic History  ? Marital status: Widowed  ?  Spouse name: Not on file  ? Number of children: 2  ? Years of education: Not on file  ? Highest education level: Not on file  ?Occupational History  ?  Employer: RETIRED  ?Tobacco Use  ? Smoking status: Never  ? Smokeless tobacco: Never  ?Vaping Use  ? Vaping Use: Never used  ?Substance and Sexual Activity  ? Alcohol use: Yes  ?  Comment: occasional  ? Drug  use: No  ? Sexual activity: Not Currently  ?  Birth control/protection: Post-menopausal  ?Other Topics Concern  ? Not on file  ?Social History Narrative  ? Widowed, 1 son and 1 daughter  ? Retired  ? No caffeine  ? ?Social Determinants of Health  ? ?Financial Resource Strain: Not on file  ?Food Insecurity: Not on file  ?Transportation Needs: Not on file  ?Physical Activity: Not on file  ?Stress: Not on file  ?Social Connections: Not on file  ?Intimate Partner Violence: Not on file  ? ? ?Review of Systems  ?All other systems reviewed and are negative. ? ?PHYSICAL EXAMINATION:   ? ?BP 122/84   Pulse 63   Wt 129 lb (58.5 kg)   SpO2 99%   BMI 22.67 kg/m?     ?General appearance: alert, cooperative and appears stated age ? ?Pelvic: External genitalia:  no lesions, mild erythema, some mild agglutination of the labia minora to majora. Some whitening on the perineum to the anus ?             Urethra:  normal appearing urethra with no masses, tenderness or lesions ?             Bartholins and Skenes: normal    ?             Vagina:the pessary was  removed and cleaned, removing the pessary caused a small tear in the posterior introitus. mildly atrophic appearing vagina with minimal erythema on the posterior vaginal apex. The pessary was replaced.  ?             Cervix: no lesions ?             Chaperone was present for exam. ? ?1. Pessary maintenance ?Overall doing well with her pessary ?She hasn't used the vaginal estrogen for months and has some mild vaginal irritation from the pessary.  ?Restart vaginal estrogen and f/u in 3 monthsd ? ?2. Vaginal atrophy ?- estradiol (ESTRACE) 0.1 MG/GM vaginal cream; 1 gram vaginally twice weekly  Dispense: 42.5 g; Refill: 1 ? ?3. Acute vulvitis ?- WET PREP FOR TRICH, YEAST, CLUE: negative ?- betamethasone valerate ointment (VALISONE) 0.1 %; Apply a pea sized amount topically BID for 2 weeks  Dispense: 30 g; Refill: 0 ? ?

## 2021-12-11 ENCOUNTER — Encounter: Payer: Self-pay | Admitting: Family

## 2021-12-11 DIAGNOSIS — N8110 Cystocele, unspecified: Secondary | ICD-10-CM | POA: Insufficient documentation

## 2022-01-05 ENCOUNTER — Ambulatory Visit (INDEPENDENT_AMBULATORY_CARE_PROVIDER_SITE_OTHER): Payer: Medicare Other

## 2022-01-05 DIAGNOSIS — Z0000 Encounter for general adult medical examination without abnormal findings: Secondary | ICD-10-CM | POA: Diagnosis not present

## 2022-01-05 NOTE — Progress Notes (Signed)
Virtual Visit via Telephone Note  I connected with  Tina Smith on 01/05/22 at 11:30 AM EDT by telephone and verified that I am speaking with the correct person using two identifiers.  Medicare Annual Wellness visit completed telephonically due to Covid-19 pandemic.   Persons participating in this call: This Health Coach and this patient.   Location: Patient: home Provider: office   I discussed the limitations, risks, security and privacy concerns of performing an evaluation and management service by telephone and the availability of in person appointments. The patient expressed understanding and agreed to proceed.  Unable to perform video visit due to video visit attempted and failed and/or patient does not have video capability.   Some vital signs may be absent or patient reported.   Willette Brace, LPN   Subjective:   Tina Smith is a 81 y.o. female who presents for Medicare Annual (Subsequent) preventive examination.  Review of Systems     Cardiac Risk Factors include: advanced age (>15mn, >>21women);dyslipidemia     Objective:    There were no vitals filed for this visit. There is no height or weight on file to calculate BMI.     01/05/2022   11:34 AM 03/04/2020    1:52 PM  Advanced Directives  Does Patient Have a Medical Advance Directive? Yes Yes  Type of AParamedicof AVernonLiving will  Copy of HCoffeein Chart? Yes - validated most recent copy scanned in chart (See row information) No - copy requested    Current Medications (verified) Outpatient Encounter Medications as of 01/05/2022  Medication Sig   Ascorbic Acid (VITAMIN C PO) Take by mouth.   B Complex Vitamins (B COMPLEX PO) Take by mouth.   betamethasone valerate ointment (VALISONE) 0.1 % Apply a pea sized amount topically BID for 2 weeks   Calcium Carbonate (CALCIUM 600 PO) Take 600 mg by mouth daily.   Cholecalciferol  (VITAMIN D) 1000 UNITS capsule Take 5,000 Units by mouth daily.   estradiol (ESTRACE) 0.1 MG/GM vaginal cream 1 gram vaginally twice weekly   Multiple Vitamin (MULTIVITAMIN) tablet Take 1 tablet by mouth daily.   PSYLLIUM HUSK PO Take 610 mg by mouth daily.   Sodium Hyaluronate, oral, (HYALURONIC ACID PO) Take 166 mg by mouth daily.   vitamin B-12 (CYANOCOBALAMIN) 1000 MCG tablet Take 1,000 mcg by mouth daily.   No facility-administered encounter medications on file as of 01/05/2022.    Allergies (verified) Patient has no known allergies.   History: Past Medical History:  Diagnosis Date   Deafness, partial    Diverticulosis 02/26/2012   Sleep apnea, does not tolerate CPAP    Past Surgical History:  Procedure Laterality Date   BREAST EXCISIONAL BIOPSY Left 1990   BENIGN   COLONOSCOPY  2003, 2013   CYSTOCELE REPAIR  04/2011   TONSILLECTOMY  1944   Family History  Problem Relation Age of Onset   Heart disease Mother    Colitis Mother    Colon cancer Neg Hx    Stomach cancer Neg Hx    Colon polyps Neg Hx    Esophageal cancer Neg Hx    Diabetes Neg Hx    Kidney disease Neg Hx    Social History   Socioeconomic History   Marital status: Widowed    Spouse name: Not on file   Number of children: 2   Years of education: Not on file   Highest education  level: Not on file  Occupational History    Employer: RETIRED  Tobacco Use   Smoking status: Never   Smokeless tobacco: Never  Vaping Use   Vaping Use: Never used  Substance and Sexual Activity   Alcohol use: Yes    Comment: occasional   Drug use: No   Sexual activity: Not Currently    Birth control/protection: Post-menopausal  Other Topics Concern   Not on file  Social History Narrative   Widowed, 1 son and 1 daughter   Retired   No caffeine   Social Determinants of Health   Financial Resource Strain: [place]  (01/05/2022)   Overall Financial Resource Strain (CARDIA)    Difficulty of Paying Living Expenses:  Not hard at all  Food Insecurity: No Food Insecurity (01/05/2022)   Hunger Vital Sign    Worried About Running Out of Food in the Last Year: Never true    Ran Out of Food in the Last Year: Never true  Transportation Needs: No Transportation Needs (01/05/2022)   PRAPARE - Hydrologist (Medical): No    Lack of Transportation (Non-Medical): No  Physical Activity: Inactive (01/05/2022)   Exercise Vital Sign    Days of Exercise per Week: 0 days    Minutes of Exercise per Session: 0 min  Stress: No Stress Concern Present (01/05/2022)   [place]    Feeling of Stress : Not at all  Social Connections: Moderately Isolated (01/05/2022)   Social Connection and Isolation Panel [NHANES]    Frequency of Communication with Friends and Family: More than three times a week    Frequency of Social Gatherings with Friends and Family: More than three times a week    Attends Religious Services: More than 4 times per year    Active Member of [employer] or Organizations: No    Attends Archivist Meetings: Never    Marital Status: Widowed    Tobacco Counseling Counseling given: Not Answered   Clinical Intake:  Pre-visit preparation completed: Yes  Pain : No/denies pain     BMI - recorded: 22.67 Nutritional Status: BMI of 19-24  Normal Nutritional Risks: None Diabetes: No  How often do you need to have someone help you when you read instructions, pamphlets, or other written materials from your doctor or pharmacy?: 1 - Never  Diabetic?no  Interpreter Needed?: No  Information entered by :: Charlott Rakes, LPN   Activities of Daily Living    01/05/2022   11:35 AM  In your present state of health, do you have any difficulty performing the following activities:  Hearing? 1  Comment left hearing aid  Vision? 0  Difficulty concentrating or making decisions? 0  Walking or climbing stairs? 0   Dressing or bathing? 0  Doing errands, shopping? 0  Preparing Food and eating ? N  Using the Toilet? N  In the past six months, have you accidently leaked urine? N  Do you have problems with loss of bowel control? N  Managing your Medications? N  Managing your Finances? N  Housekeeping or managing your Housekeeping? N    Patient Care Team: Jeanie Sewer, NP as PCP - General (Family Medicine) Janyth Pupa, DO as Consulting Physician (Obstetrics and Gynecology)  Indicate any recent Medical Services you may have received from other than [facility] providers in the past year (date may be approximate).     Assessment:   This is a routine wellness  examination for Tina Smith.  Hearing/Vision screen Hearing Screening - Comments:: Pt wears left ear hearing aid  Vision Screening - Comments:: Pt follows up with dr Ellie Lunch for annual eye exams   Dietary issues and exercise activities discussed: Current Exercise Habits: The patient does not participate in regular exercise at present   Goals Addressed             This Visit's Progress    Patient Stated       Get rid of this chronic fatigue        Depression Screen    01/05/2022   11:34 AM 05/08/2021    8:45 AM 08/22/2020   11:01 AM 04/14/2020    1:27 PM 03/04/2020    1:48 PM 01/29/2020    1:23 PM 04/15/2018   12:57 PM  PHQ 2/9 Scores  PHQ - 2 Score 0 0 0 0 0 0 0    Fall Risk    01/05/2022   11:35 AM 08/22/2020   11:01 AM 04/14/2020    1:28 PM 03/04/2020    1:53 PM 01/29/2020    1:22 PM  [place] in the past year? 0 0 0 0 0  Number falls in past yr: 0   0 0  Injury with Fall? 0   0 0  Risk for fall due to : Impaired vision  No Fall Risks Impaired vision   Follow up Falls prevention discussed        FALL RISK PREVENTION PERTAINING TO THE HOME:  Any stairs in or around the home? No  If so, are there any without handrails? No  Home free of loose throw rugs in walkways, pet beds, electrical cords, etc? Yes  Adequate  lighting in your home to reduce risk of falls? Yes   ASSISTIVE DEVICES UTILIZED TO PREVENT FALLS:  Life alert? No  Use of a cane, walker or w/c? No  Grab bars in the bathroom? Yes  Shower chair or bench in shower? No  Elevated toilet seat or a handicapped toilet? No   TIMED UP AND GO:  Was the test performed? No .   Cognitive Function:        01/05/2022   11:37 AM 03/04/2020    1:57 PM  6CIT Screen  What Year? 0 points 0 points  What month? 0 points 0 points  What time? 0 points   Count back from 20 0 points 0 points  Months in reverse 0 points 0 points  Repeat phrase 0 points 0 points  Total Score 0 points     Immunizations Immunization History  Administered Date(s) Administered   Fluad Quad(high Dose 65+) 04/14/2020, 05/08/2021   Influenza Split 04/24/2011, 04/15/2012, 04/14/2015   Influenza Whole 05/09/2010   Influenza, High Dose Seasonal PF 04/15/2018   Influenza,inj,Quad PF,6+ Mos 05/12/2013, 05/13/2014, 04/14/2015, 04/13/2016, 04/25/2017   Influenza-Unspecified 04/21/2019   PFIZER Comirnaty(Gray Top)Covid-19 Tri-Sucrose Vaccine 10/21/2020   PFIZER(Purple Top)SARS-COV-2 Vaccination 08/30/2019, 09/23/2019, 04/23/2020   Pfizer Covid-19 Vaccine Bivalent Booster 31yr & up 06/20/2021   Pneumococcal Conjugate-13 05/13/2014   Pneumococcal Polysaccharide-23 05/09/2005, 05/13/2014   Zoster Recombinat (Shingrix) 03/05/2017, 05/15/2017   Zoster, Live 09/02/2007, 03/05/2017, 05/15/2017    TDAP status: Due, Education has been provided regarding the importance of this vaccine. Advised may receive this vaccine at local pharmacy or Health Dept. Aware to provide a copy of the vaccination record if obtained from local pharmacy or Health Dept. Verbalized acceptance and understanding.  Flu Vaccine status: Up to date  Pneumococcal vaccine status: Up to date  Covid-19 vaccine status: Completed vaccines  Qualifies for Shingles Vaccine? Yes   Zostavax completed Yes   Shingrix  Completed?: Yes  Screening Tests Health Maintenance  Topic Date Due   TETANUS/TDAP  Never done   COVID-19 Vaccine (6 - Pfizer series) 10/19/2021   INFLUENZA VACCINE  02/06/2022   Pneumonia Vaccine 21+ Years old  Completed   DEXA SCAN  Completed   Zoster Vaccines- Shingrix  Completed   HPV VACCINES  Aged Out    Health Maintenance  Health Maintenance Due  Topic Date Due   TETANUS/TDAP  Never done   COVID-19 Vaccine (6 - Pfizer series) 10/19/2021    Colorectal cancer screening: No longer required.   Mammogram status: Completed 01/24/21. Repeat every year  Bone Density status: Completed 06/20/21. Results reflect: Bone density results: OSTEOPOROSIS. Repeat every 2 years.   Additional Screening:  Vision Screening: Recommended annual ophthalmology exams for early detection of glaucoma and other disorders of the eye. Is the patient up to date with their annual eye exam?  Yes  Who is the provider or what is the name of the office in which the patient attends annual eye exams? Dr Ellie Lunch  If pt is not established with a provider, would they like to be referred to a provider to establish care? No .   Dental Screening: Recommended annual dental exams for proper oral hygiene  Community Resource Referral / Chronic Care Management: CRR required this visit?  No   CCM required this visit?  No      Plan:     I have personally reviewed and noted the following in the patient's chart:   Medical and social history Use of alcohol, tobacco or illicit drugs  Current medications and supplements including opioid prescriptions.  Functional ability and status Nutritional status Physical activity Advanced directives List of other physicians Hospitalizations, surgeries, and ER visits in previous 12 months Vitals Screenings to include cognitive, depression, and falls Referrals and appointments  In addition, I have reviewed and discussed with patient certain preventive protocols, quality  metrics, and best practice recommendations. A written personalized care plan for preventive services as well as general preventive health recommendations were provided to patient.     Willette Brace, LPN   3/00/7622   Nurse Notes: none

## 2022-01-05 NOTE — Patient Instructions (Signed)
Tina Smith , Thank you for taking time to come for your Medicare Wellness Visit. I appreciate your ongoing commitment to your health goals. Please review the following plan we discussed and let me know if I can assist you in the future.   Screening recommendations/referrals: Colonoscopy:  no longer required  Mammogram: Done 01/24/21 repeat every  year  Bone Density: Done 06/20/21 repeat every 2 years  Recommended yearly ophthalmology/optometry visit for glaucoma screening and checkup Recommended yearly dental visit for hygiene and checkup  Vaccinations: Influenza vaccine: Done 05/08/21 repeat every year  Pneumococcal vaccine: Up to date Tdap vaccine: due and discussed  Shingles vaccine: Completed 8/28, 05/15/17   Covid-19:Completed 2/21, 3/17, 04/23/20, 06/20/21, 10/16/20  Advanced directives:copies in chart   Conditions/risks identified: get rid of excessive fatigue   Next appointment: Follow up in one year for your annual wellness visit    Preventive Care 65 Years and Older, Female Preventive care refers to lifestyle choices and visits with your health care provider that can promote health and wellness. What does preventive care include? A yearly physical exam. This is also called an annual well check. Dental exams once or twice a year. Routine eye exams. Ask your health care provider how often you should have your eyes checked. Personal lifestyle choices, including: Daily care of your teeth and gums. Regular physical activity. Eating a healthy diet. Avoiding tobacco and drug use. Limiting alcohol use. Practicing safe sex. Taking low-dose aspirin every day. Taking vitamin and mineral supplements as recommended by your health care provider. What happens during an annual well check? The services and screenings done by your health care provider during your annual well check will depend on your age, overall health, lifestyle risk factors, and family history of disease. Counseling   Your health care provider may ask you questions about your: Alcohol use. Tobacco use. Drug use. Emotional well-being. Home and relationship well-being. Sexual activity. Eating habits. History of falls. Memory and ability to understand (cognition). Work and work Statistician. Reproductive health. Screening  You may have the following tests or measurements: Height, weight, and BMI. Blood pressure. Lipid and cholesterol levels. These may be checked every 5 years, or more frequently if you are over 38 years old. Skin check. Lung cancer screening. You may have this screening every year starting at age 65 if you have a 30-pack-year history of smoking and currently smoke or have quit within the past 15 years. Fecal occult blood test (FOBT) of the stool. You may have this test every year starting at age 26. Flexible sigmoidoscopy or colonoscopy. You may have a sigmoidoscopy every 5 years or a colonoscopy every 10 years starting at age 72. Hepatitis C blood test. Hepatitis B blood test. Sexually transmitted disease (STD) testing. Diabetes screening. This is done by checking your blood sugar (glucose) after you have not eaten for a while (fasting). You may have this done every 1-3 years. Bone density scan. This is done to screen for osteoporosis. You may have this done starting at age 67. Mammogram. This may be done every 1-2 years. Talk to your health care provider about how often you should have regular mammograms. Talk with your health care provider about your test results, treatment options, and if necessary, the need for more tests. Vaccines  Your health care provider may recommend certain vaccines, such as: Influenza vaccine. This is recommended every year. Tetanus, diphtheria, and acellular pertussis (Tdap, Td) vaccine. You may need a Td booster every 10 years. Zoster vaccine. You may  need this after age 51. Pneumococcal 13-valent conjugate (PCV13) vaccine. One dose is recommended  after age 23. Pneumococcal polysaccharide (PPSV23) vaccine. One dose is recommended after age 65. Talk to your health care provider about which screenings and vaccines you need and how often you need them. This information is not intended to replace advice given to you by your health care provider. Make sure you discuss any questions you have with your health care provider. Document Released: 07/22/2015 Document Revised: 03/14/2016 Document Reviewed: 04/26/2015 Elsevier Interactive Patient Education  2017 [place] Prevention in the Home Falls can cause injuries. They can happen to people of all ages. There are many things you can do to make your home safe and to help prevent falls. What can I do on the outside of my home? Regularly fix the edges of walkways and driveways and fix any cracks. Remove anything that might make you trip as you walk through a door, such as a raised step or threshold. Trim any bushes or trees on the path to your home. Use bright outdoor lighting. Clear any walking paths of anything that might make someone trip, such as rocks or tools. Regularly check to see if handrails are loose or broken. Make sure that both sides of any steps have handrails. Any raised decks and porches should have guardrails on the edges. Have any leaves, snow, or ice cleared regularly. Use sand or salt on walking paths during winter. Clean up any spills in your garage right away. This includes oil or grease spills. What can I do in the bathroom? Use night lights. Install grab bars by the toilet and in the tub and shower. Do not use towel bars as grab bars. Use non-skid mats or decals in the tub or shower. If you need to sit down in the shower, use a plastic, non-slip stool. Keep the floor dry. Clean up any water that spills on the floor as soon as it happens. Remove soap buildup in the tub or shower regularly. Attach bath mats securely with double-sided non-slip rug tape. Do not  have throw rugs and other things on the floor that can make you trip. What can I do in the bedroom? Use night lights. Make sure that you have a light by your bed that is easy to reach. Do not use any sheets or blankets that are too big for your bed. They should not hang down onto the floor. Have a firm chair that has side arms. You can use this for support while you get dressed. Do not have throw rugs and other things on the floor that can make you trip. What can I do in the kitchen? Clean up any spills right away. Avoid walking on wet floors. Keep items that you use a lot in easy-to-reach places. If you need to reach something above you, use a strong step stool that has a grab bar. Keep electrical cords out of the way. Do not use floor polish or wax that makes floors slippery. If you must use wax, use non-skid floor wax. Do not have throw rugs and other things on the floor that can make you trip. What can I do with my stairs? Do not leave any items on the stairs. Make sure that there are handrails on both sides of the stairs and use them. Fix handrails that are broken or loose. Make sure that handrails are as long as the stairways. Check any carpeting to make sure that it is firmly attached  to the stairs. Fix any carpet that is loose or worn. Avoid having throw rugs at the top or bottom of the stairs. If you do have throw rugs, attach them to the floor with carpet tape. Make sure that you have a light switch at the top of the stairs and the bottom of the stairs. If you do not have them, ask someone to add them for you. What else can I do to help prevent falls? Wear shoes that: Do not have high heels. Have rubber bottoms. Are comfortable and fit you well. Are closed at the toe. Do not wear sandals. If you use a stepladder: Make sure that it is fully opened. Do not climb a closed stepladder. Make sure that both sides of the stepladder are locked into place. Ask someone to hold it for  you, if possible. Clearly mark and make sure that you can see: Any grab bars or handrails. First and last steps. Where the edge of each step is. Use tools that help you move around (mobility aids) if they are needed. These include: Canes. Walkers. Scooters. Crutches. Turn on the lights when you go into a dark area. Replace any light bulbs as soon as they burn out. Set up your furniture so you have a clear path. Avoid moving your furniture around. If any of your floors are uneven, fix them. If there are any pets around you, be aware of where they are. Review your medicines with your doctor. Some medicines can make you feel dizzy. This can increase your chance of falling. Ask your doctor what other things that you can do to help prevent falls. This information is not intended to replace advice given to you by your health care provider. Make sure you discuss any questions you have with your health care provider. Document Released: 04/21/2009 Document Revised: 12/01/2015 Document Reviewed: 07/30/2014 Elsevier Interactive Patient Education  2017 [employer].

## 2022-01-07 NOTE — Progress Notes (Signed)
Subjective:     Patient ID: Tina Smith, female    DOB: April 16, 1941, 81 y.o.   MRN: [id number]  Chief Complaint  Patient presents with   Fatigue    Pt c/o fatigue, States this has been going on for years.    Numbness    Pt c/o numbness in both feet for years.     HPI: Hyperlipidemia: Patient is currently maintained on the following medication for hyperlipidemia: none, lifestyle modifications. Patient reports good compliance with low fat/low cholesterol diet.  Last lipid panel as follows: Lab Results  Component Value Date   CHOL 278 (H) 01/29/2020   HDL 44 (L) 01/29/2020   LDLCALC 196 (H) 01/29/2020   LDLDIRECT 204.0 04/15/2018   TRIG 202 (H) 01/29/2020   CHOLHDL 6.3 (H) 01/29/2020   Neuropathy: She describes symptoms of numbness. Onset of symptoms was  a few years ago . Symptoms are currently of mild severity. Symptoms occur intermittently and last minutes. The patient denies burning, lancinating pain, and tingling. Symptoms are equal in both feet.  Previous treatment has included nothing, Vitamin B-12 supplement, which has not improved symptoms. Fatigue Pt. reports onset: years ago. Occurs upon awakening  yes during the day; evenings. Hours of sleep: 8 Works during retired. New medications or change in current med doses no. Recent labs yes through ophthalmology office, waiting on copy of results. Exercise: yes, water aerobics 3x/week High carb/sugar diet: no Sx of depression: denies.   Assessment & Plan:   Problem List Items Addressed This Visit       Nervous and Auditory   Chronic fatigue syndrome - Primary    pt continues to c/o sx despite sleeping well, exercising during day, hydrating with water qd. Continue to advised on Vitamin B complex qd, low white carb diet, avoiding sugar, increase exercise to daily at least 20 min. Checking labs today.       Relevant Orders   CBC with Differential/Platelet   Comp Met (CMET)   TSH   T4, free     Musculoskeletal  and Integument   Osteoporosis    Chronic - right hip, left hip and radius are Osteopenic. pt taking Calcium and Vitamin D OTC qd. Will recheck Vitamin D level today. Denies any fractures.       Relevant Orders   Vitamin D (25 hydroxy)     Other   Hyperlipidemia    Chronic - last lipid check 2 yrs ago, pt reports being on a statin in past, but unsure why she no longer is taking, rechecking lipids today.      Relevant Orders   Lipid panel   Numbness    Chronic - pt reports having for years, just in her feet and mostly she notices it at night when she lays down. Denies any worsening of sx, no tingling or burning pain. Denies needing/wanting to take any medication now, will continue to monitor.        Outpatient Medications Prior to Visit  Medication Sig Dispense Refill   Ascorbic Acid (VITAMIN C PO) Take by mouth.     B Complex Vitamins (B COMPLEX PO) Take by mouth.     betamethasone valerate ointment (VALISONE) 0.1 % Apply a pea sized amount topically BID for 2 weeks 30 g 0   Calcium Carbonate (CALCIUM 600 PO) Take 600 mg by mouth daily.     Cholecalciferol (VITAMIN D) 1000 UNITS capsule Take 5,000 Units by mouth daily.     estradiol (ESTRACE) 0.1 MG/GM  vaginal cream 1 gram vaginally twice weekly 42.5 g 1   Multiple Vitamin (MULTIVITAMIN) tablet Take 1 tablet by mouth daily.     PSYLLIUM HUSK PO Take 610 mg by mouth daily.     Sodium Hyaluronate, oral, (HYALURONIC ACID PO) Take 166 mg by mouth daily.     vitamin B-12 (CYANOCOBALAMIN) 1000 MCG tablet Take 1,000 mcg by mouth daily.     No facility-administered medications prior to visit.    Past Medical History:  Diagnosis Date   Deafness, partial    Diverticulosis 02/26/2012   Dyslipidemia 07/05/2010   Qualifier: Diagnosis of  By: Burnice Logan  MD, Doretha Sou    Sleep apnea, does not tolerate CPAP     Past Surgical History:  Procedure Laterality Date   BREAST EXCISIONAL BIOPSY Left 1990   BENIGN   COLONOSCOPY  2003, 2013    CYSTOCELE REPAIR  04/2011   TONSILLECTOMY  1944    No Known Allergies     Objective:    Physical Exam Vitals and nursing note reviewed.  Constitutional:      Appearance: Normal appearance.  Cardiovascular:     Rate and Rhythm: Normal rate and regular rhythm.  Pulmonary:     Effort: Pulmonary effort is normal.     Breath sounds: Normal breath sounds.  Musculoskeletal:        General: Normal range of motion.  Skin:    General: Skin is warm and dry.  Neurological:     Mental Status: She is alert.  Psychiatric:        Mood and Affect: Mood normal.        Behavior: Behavior normal.    BP 118/64 (BP Location: Left Arm, Patient Position: Sitting, Cuff Size: Normal)   Pulse 79   Temp 97.7 F (36.5 C) (Temporal)   Ht 5' 3" (1.6 m)   Wt 128 lb 8 oz (58.3 kg)   SpO2 99%   BMI 22.76 kg/m  Wt Readings from Last 3 Encounters:  01/08/22 128 lb 8 oz (58.3 kg)  11/22/21 129 lb (58.5 kg)  05/18/21 131 lb (59.4 kg)     Jeanie Sewer, NP

## 2022-01-08 ENCOUNTER — Ambulatory Visit (INDEPENDENT_AMBULATORY_CARE_PROVIDER_SITE_OTHER): Payer: Medicare Other | Admitting: Family

## 2022-01-08 ENCOUNTER — Encounter: Payer: Self-pay | Admitting: Family

## 2022-01-08 VITALS — BP 118/64 | HR 79 | Temp 97.7°F | Ht 63.0 in | Wt 128.5 lb

## 2022-01-08 DIAGNOSIS — G9332 Myalgic encephalomyelitis/chronic fatigue syndrome: Secondary | ICD-10-CM

## 2022-01-08 DIAGNOSIS — M810 Age-related osteoporosis without current pathological fracture: Secondary | ICD-10-CM | POA: Diagnosis not present

## 2022-01-08 DIAGNOSIS — E782 Mixed hyperlipidemia: Secondary | ICD-10-CM

## 2022-01-08 DIAGNOSIS — R200 Anesthesia of skin: Secondary | ICD-10-CM | POA: Insufficient documentation

## 2022-01-08 LAB — CBC WITH DIFFERENTIAL/PLATELET
Basophils Absolute: 0.1 10*3/uL (ref 0.0–0.1)
Basophils Relative: 1.2 % (ref 0.0–3.0)
Eosinophils Absolute: 0.2 10*3/uL (ref 0.0–0.7)
Eosinophils Relative: 2.6 % (ref 0.0–5.0)
HCT: 41.3 % (ref 36.0–46.0)
Hemoglobin: 13.6 g/dL (ref 12.0–15.0)
Lymphocytes Relative: 32.6 % (ref 12.0–46.0)
Lymphs Abs: 2 10*3/uL (ref 0.7–4.0)
MCHC: 33 g/dL (ref 30.0–36.0)
MCV: 91.2 fl (ref 78.0–100.0)
Monocytes Absolute: 0.4 10*3/uL (ref 0.1–1.0)
Monocytes Relative: 6.5 % (ref 3.0–12.0)
Neutro Abs: 3.4 10*3/uL (ref 1.4–7.7)
Neutrophils Relative %: 57.1 % (ref 43.0–77.0)
Platelets: 230 10*3/uL (ref 150.0–400.0)
RBC: 4.53 Mil/uL (ref 3.87–5.11)
RDW: 13.5 % (ref 11.5–15.5)
WBC: 6 10*3/uL (ref 4.0–10.5)

## 2022-01-08 LAB — COMPREHENSIVE METABOLIC PANEL
ALT: 13 U/L (ref 0–35)
AST: 17 U/L (ref 0–37)
Albumin: 4 g/dL (ref 3.5–5.2)
Alkaline Phosphatase: 66 U/L (ref 39–117)
BUN: 19 mg/dL (ref 6–23)
CO2: 27 mEq/L (ref 19–32)
Calcium: 9.5 mg/dL (ref 8.4–10.5)
Chloride: 108 mEq/L (ref 96–112)
Creatinine, Ser: 1.32 mg/dL — ABNORMAL HIGH (ref 0.40–1.20)
GFR: 37.71 mL/min — ABNORMAL LOW (ref >60.00)
Glucose, Bld: 77 mg/dL (ref 70–99)
Potassium: 4 mEq/L (ref 3.5–5.1)
Sodium: 142 mEq/L (ref 135–145)
Total Bilirubin: 0.4 mg/dL (ref 0.2–1.2)
Total Protein: 6.4 g/dL (ref 6.0–8.3)

## 2022-01-08 LAB — T4, FREE: Free T4: 0.9 ng/dL (ref 0.60–1.60)

## 2022-01-08 LAB — LIPID PANEL
Cholesterol: 265 mg/dL — ABNORMAL HIGH (ref 0–200)
HDL: 41.4 mg/dL (ref >39.00)
LDL Cholesterol: 191 mg/dL — ABNORMAL HIGH (ref 0–99)
NonHDL: 223.63
Total CHOL/HDL Ratio: 6
Triglycerides: 164 mg/dL — ABNORMAL HIGH (ref 0.0–149.0)
VLDL: 32.8 mg/dL (ref 0.0–40.0)

## 2022-01-08 LAB — VITAMIN D 25 HYDROXY (VIT D DEFICIENCY, FRACTURES): VITD: 104.17 ng/mL (ref 30.00–100.00)

## 2022-01-08 LAB — TSH: TSH: 1.74 u[IU]/mL (ref 0.35–5.50)

## 2022-01-08 NOTE — Patient Instructions (Addendum)
It was very nice to see you today!   Go to the lab for blood work today.  I will review the results via MyChart in a few days.  Magnesium Glycinate or L-Threonate (Magtein) - are easier on the bowels vs. Mag. Citrate or Oxide. They can also help with lowering blood pressure, anxiety, muscle recovery, improved brain function, and if taken before bedtime can help with maintaining your sleep (not falling asleep). Look for Chelated form as this is better absorbed. Turmeric (Curcumin) with Bioperine (1500-2,'000mg'$ /day in divided doses) is an anti-inflammatory to help with arthritis pain/stiffness.  For any over the counter supplement or vitamin, look for organic or if it has a 3rd party seal from NSF international, [employer] or USP. This authenticates the quality but not the efficacy (since not FDA approved).     PLEASE NOTE:  If you had any lab tests please let us know if you have not heard back within a few days. You may see your results on MyChart before we have a chance to review them but we will give you a call once they are reviewed by [place]. If we ordered any referrals today, please let us know if you have not heard from their office within the next week.

## 2022-01-08 NOTE — Assessment & Plan Note (Addendum)
Chronic - right hip, left hip and radius are Osteopenic. pt taking Calcium and Vitamin D OTC qd. Will recheck Vitamin D level today. Denies any fractures.

## 2022-01-08 NOTE — Assessment & Plan Note (Signed)
Chronic - pt reports having for years, just in her feet and mostly she notices it at night when she lays down. Denies any worsening of sx, no tingling or burning pain. Denies needing/wanting to take any medication now, will continue to monitor.

## 2022-01-08 NOTE — Assessment & Plan Note (Addendum)
Chronic - last lipid check 2 yrs ago, pt reports being on a statin in past, but unsure why she no longer is taking, rechecking lipids today.

## 2022-01-08 NOTE — Assessment & Plan Note (Signed)
pt continues to c/o sx despite sleeping well, exercising during day, hydrating with water qd. Continue to advised on Vitamin B complex qd, low white carb diet, avoiding sugar, increase exercise to daily at least 20 min. Checking labs today.

## 2022-01-14 NOTE — Progress Notes (Signed)
Hi Tina Smith,  Your glucose, electrolytes, blood count, thyroid, liver function are all normal. Your kidney function is low. Be sure to drink at least 2 liters of water daily and be sure you are not taking aspirin, Advil (Ibuprofen), or Motrin, or Aleve every day as this can harm the kidneys. Tylenol is ok if needed daily.  Your total cholesterol number & LDL (bad #) are high. Need to reduce any fried foods, alcohol, nonnutritional snacks e.g. chips/cookies,pies, cakes and candies, fatty meat (red meat), high fat dairy foods:  including cheese, milk, ice cream.  Increase fruits/vegetables/fiber.   Continue or restart an exercise routine, shooting for 29mn 5-7days per week.   Your Vitamin D level is high. Be sure you are only taking 1,000units of your over the counter supplement daily.  Any questions, let me know!

## 2022-01-16 ENCOUNTER — Encounter: Payer: Self-pay | Admitting: Family

## 2022-02-21 ENCOUNTER — Ambulatory Visit: Payer: Medicare Other | Admitting: Obstetrics and Gynecology

## 2022-03-20 ENCOUNTER — Encounter: Payer: Self-pay | Admitting: Family

## 2022-03-27 ENCOUNTER — Ambulatory Visit (INDEPENDENT_AMBULATORY_CARE_PROVIDER_SITE_OTHER): Payer: Medicare Other | Admitting: Family

## 2022-03-27 VITALS — BP 130/63 | HR 84 | Temp 97.6°F | Ht 63.0 in | Wt 124.6 lb

## 2022-03-27 DIAGNOSIS — Z23 Encounter for immunization: Secondary | ICD-10-CM | POA: Diagnosis not present

## 2022-03-27 DIAGNOSIS — R194 Change in bowel habit: Secondary | ICD-10-CM | POA: Diagnosis not present

## 2022-03-27 NOTE — Assessment & Plan Note (Addendum)
   started in June  for last 2 weeks, pt has a written log of all foods, meds, and BMs  she reports "diarrhea" as 1x/day, is loose, not watery, and may occur in same day as a soft BM, or 3 days in a row, but not every day, and some days she may have a small, hard stool  Advised pt to take Magnesium qhs, if hard stool the next day add a stool softener qhs, then stop stool softener if stool becomes loose.  Stop the fiber caps and antidiarrheal pills for now  Eat fiber rich diet, drink at least 2L of fluid qd  Let me know in 1-2w how things are going, if still having concerns, will have her f/u w/Dr Carlean Purl

## 2022-03-27 NOTE — Progress Notes (Signed)
Patient ID: Tina Smith, female    DOB: 09/15/1940, 81 y.o.   MRN: [id number]  Chief Complaint  Patient presents with   Medication Reaction    Pt c/o diarrhea for a while.  Pt states she is going at least twice a day or more.     HPI:      Diarrhea:  reports 1-2 loose stools daily. She says it started a few months ago. She had a normal soft BM after waking this am, then about an hour later she had a loose stool. She reports alternating normal, hard, and loose bowels. Reports taking Magnesium glycinate, stool softenors, fiber capsules (takes 1), for her hard stools and anti-diarrheal pills, charcoal capsules for diarrhea.      Assessment & Plan:   Problem List Items Addressed This Visit       Other   Bowel habit changes    started in June for last 2 weeks, pt has a written log of all foods, meds, and BMs she reports "diarrhea" as 1x/day, is loose, not watery, and may occur in same day as a soft BM, or 3 days in a row, but not every day, and some days she may have a small, hard stool Advised pt to take Magnesium qhs, if hard stool the next day add a stool softener qhs, then stop stool softener if stool becomes loose. Stop the fiber caps and antidiarrheal pills for now Eat fiber rich diet, drink at least 2L of fluid qd Let me know in 1-2w how things are going, if still having concerns, will have her f/u w/Dr Carlean Purl      Other Visit Diagnoses     Need for immunization against influenza    -  Primary   Relevant Orders   Flu Vaccine QUAD High Dose(Fluad) (Completed)      Subjective:    Outpatient Medications Prior to Visit  Medication Sig Dispense Refill   Ascorbic Acid (VITAMIN C PO) Take by mouth.     B Complex Vitamins (B COMPLEX PO) Take by mouth.     betamethasone valerate ointment (VALISONE) 0.1 % Apply a pea sized amount topically BID for 2 weeks 30 g 0   Calcium Carbonate (CALCIUM 600 PO) Take 600 mg by mouth daily.     estradiol (ESTRACE) 0.1 MG/GM vaginal cream 1  gram vaginally twice weekly 42.5 g 1   Multiple Vitamin (MULTIVITAMIN) tablet Take 1 tablet by mouth daily.     PSYLLIUM HUSK PO Take 610 mg by mouth as needed.     Sodium Hyaluronate, oral, (HYALURONIC ACID PO) Take 166 mg by mouth daily.     vitamin B-12 (CYANOCOBALAMIN) 1000 MCG tablet Take 1,000 mcg by mouth daily.     Cholecalciferol (VITAMIN D) 1000 UNITS capsule Take 5,000 Units by mouth daily. (Patient not taking: Reported on 03/27/2022)     No facility-administered medications prior to visit.   Past Medical History:  Diagnosis Date   Deafness, partial    Diverticulosis 02/26/2012   Dyslipidemia 07/05/2010   Qualifier: Diagnosis of  By: Burnice Logan  MD, Doretha Sou    Sleep apnea, does not tolerate CPAP    Past Surgical History:  Procedure Laterality Date   BREAST EXCISIONAL BIOPSY Left 1990   BENIGN   COLONOSCOPY  2003, 2013   CYSTOCELE REPAIR  04/2011   TONSILLECTOMY  1944   No Known Allergies    Objective:    Physical Exam Vitals and nursing note reviewed.  Constitutional:  Appearance: Normal appearance.  Cardiovascular:     Rate and Rhythm: Normal rate and regular rhythm.  Pulmonary:     Effort: Pulmonary effort is normal.     Breath sounds: Normal breath sounds.  Musculoskeletal:        General: Normal range of motion.  Skin:    General: Skin is warm and dry.  Neurological:     Mental Status: She is alert.  Psychiatric:        Mood and Affect: Mood normal.        Behavior: Behavior normal.    BP 130/63 (BP Location: Left Arm, Patient Position: Sitting, Cuff Size: Large)   Pulse 84   Temp 97.6 F (36.4 C) (Temporal)   Ht '5\' 3"'$  (1.6 m)   Wt 124 lb 9.6 oz (56.5 kg)   SpO2 96%   BMI 22.07 kg/m  Wt Readings from Last 3 Encounters:  03/27/22 124 lb 9.6 oz (56.5 kg)  01/08/22 128 lb 8 oz (58.3 kg)  11/22/21 129 lb (58.5 kg)       Jeanie Sewer, NP

## 2022-03-27 NOTE — Patient Instructions (Addendum)
It was very nice to see you today!  For your bowels let's try this plan:  Take one Magnesium glycinate at bedtime. If you have hard stool the next day, then take 1 Magnesium with 1 stool softenor. Then go back to just 1 Magnesium pill at bedtime. If your stool is loose and brown do NOT take any Loperamide (anti-diarrheal pills).  ONLY take 1 Loperamide pill if your stool is watery.  If your stool is watery 1 or 2 times a day, then don't take any Magnesium or stool softenors that night. Resume taking the Magnesium when you have just a soft stool during the day. Remember to drink at least 2 liters = 8 cups of fluid daily!  Let me know how things are going after a week or 2!    PLEASE NOTE:  If you had any lab tests please let us know if you have not heard back within a few days. You may see your results on MyChart before we have a chance to review them but we will give you a call once they are reviewed by [place]. If we ordered any referrals today, please let us know if you have not heard from their office within the next week.

## 2022-03-28 ENCOUNTER — Encounter: Payer: Self-pay | Admitting: Family

## 2022-03-29 ENCOUNTER — Encounter: Payer: Self-pay | Admitting: Family

## 2022-05-01 DIAGNOSIS — L570 Actinic keratosis: Secondary | ICD-10-CM | POA: Diagnosis not present

## 2022-05-01 DIAGNOSIS — D229 Melanocytic nevi, unspecified: Secondary | ICD-10-CM | POA: Diagnosis not present

## 2022-05-01 DIAGNOSIS — L814 Other melanin hyperpigmentation: Secondary | ICD-10-CM | POA: Diagnosis not present

## 2022-05-01 DIAGNOSIS — L7211 Pilar cyst: Secondary | ICD-10-CM | POA: Diagnosis not present

## 2022-05-01 DIAGNOSIS — L578 Other skin changes due to chronic exposure to nonionizing radiation: Secondary | ICD-10-CM | POA: Diagnosis not present

## 2022-05-01 DIAGNOSIS — L821 Other seborrheic keratosis: Secondary | ICD-10-CM | POA: Diagnosis not present

## 2022-05-03 ENCOUNTER — Encounter: Payer: Self-pay | Admitting: Family

## 2022-05-04 MED ORDER — SACCHAROMYCES BOULARDII 250 MG PO CAPS: 250.0000 mg | ORAL_CAPSULE | Freq: Two times a day (BID) | ORAL | 0 refills | Status: DC

## 2022-05-17 ENCOUNTER — Ambulatory Visit (INDEPENDENT_AMBULATORY_CARE_PROVIDER_SITE_OTHER): Payer: Medicare Other | Admitting: Obstetrics and Gynecology

## 2022-05-17 ENCOUNTER — Encounter: Payer: Self-pay | Admitting: Obstetrics and Gynecology

## 2022-05-17 VITALS — BP 110/68 | HR 66 | Wt 124.0 lb

## 2022-05-17 DIAGNOSIS — N952 Postmenopausal atrophic vaginitis: Secondary | ICD-10-CM

## 2022-05-17 DIAGNOSIS — Z4689 Encounter for fitting and adjustment of other specified devices: Secondary | ICD-10-CM | POA: Diagnosis not present

## 2022-05-17 NOTE — Progress Notes (Signed)
GYNECOLOGY  VISIT   HPI: 81 y.o.   Widowed White or Caucasian Not Hispanic or Latino  female   [phone] with No LMP recorded. Patient is postmenopausal.   here for pessary maintenance. She is doing fine. No vaginal bleeding. She left it out for a weeks in September and felt fine. She is wondering if she needs the pessary.  She uses estrace vaginal cream ~1 x week.   GYNECOLOGIC HISTORY: No LMP recorded. Patient is postmenopausal. Contraception:pmp Menopausal hormone therapy: estradiol         OB History     Gravida  2   Para  2   Term  2   Preterm      AB      Living  2      SAB      IAB      Ectopic      Multiple      Live Births  2              Patient Active Problem List   Diagnosis Date Noted   Bowel habit changes 03/27/2022   Numbness 01/08/2022   Cystocele, unspecified 12/11/2021   Chronic fatigue syndrome 11/10/2020   Chronic kidney disease, stage 3 unspecified ([place]) 11/10/2020   Hyperlipidemia 11/10/2020   Osteoporosis 11/10/2020   H/O: rheumatic fever 01/29/2020   Uterine prolapse 01/29/2020   Osteopenia 02/03/2013   History of colonic polyps 03/05/2012   Osteoarthritis 07/05/2010    Past Medical History:  Diagnosis Date   Deafness, partial    Diverticulosis 02/26/2012   Dyslipidemia 07/05/2010   Qualifier: Diagnosis of  By: Burnice Logan  MD, Doretha Sou    Sleep apnea, does not tolerate CPAP     Past Surgical History:  Procedure Laterality Date   BREAST EXCISIONAL BIOPSY Left 1990   BENIGN   COLONOSCOPY  2003, 2013   CYSTOCELE REPAIR  04/2011   TONSILLECTOMY  1944    Current Outpatient Medications  Medication Sig Dispense Refill   Ascorbic Acid (VITAMIN C PO) Take by mouth.     B Complex Vitamins (B COMPLEX PO) Take by mouth.     betamethasone valerate ointment (VALISONE) 0.1 % Apply a pea sized amount topically BID for 2 weeks 30 g 0   Calcium Carbonate (CALCIUM 600 PO) Take 600 mg by mouth daily.     estradiol (ESTRACE) 0.1  MG/GM vaginal cream 1 gram vaginally twice weekly 42.5 g 1   MAGNESIUM PO Take by mouth.     Multiple Vitamin (MULTIVITAMIN) tablet Take 1 tablet by mouth daily.     PSYLLIUM HUSK PO Take 610 mg by mouth as needed.     saccharomyces boulardii (FLORASTOR) 250 MG capsule Take 1 capsule (250 mg total) by mouth 2 (two) times daily. 30 capsule 0   Sodium Hyaluronate, oral, (HYALURONIC ACID PO) Take 166 mg by mouth daily.     No current facility-administered medications for this visit.     ALLERGIES: Patient has no known allergies.  Family History  Problem Relation Age of Onset   Heart disease Mother    Colitis Mother    Colon cancer Neg Hx    Stomach cancer Neg Hx    Colon polyps Neg Hx    Esophageal cancer Neg Hx    Diabetes Neg Hx    Kidney disease Neg Hx     Social History   Socioeconomic History   Marital status: Widowed    Spouse name: Not on file  Number of children: 2   Years of education: Not on file   Highest education level: Not on file  Occupational History    Employer: RETIRED  Tobacco Use   Smoking status: Never   Smokeless tobacco: Never  Vaping Use   Vaping Use: Never used  Substance and Sexual Activity   Alcohol use: Yes    Comment: occasional   Drug use: No   Sexual activity: Not Currently    Birth control/protection: Post-menopausal  Other Topics Concern   Not on file  Social History Narrative   Widowed, 1 son and 1 daughter   Retired   No caffeine   Social Determinants of Health   Financial Resource Strain: [place]  (01/05/2022)   Overall Financial Resource Strain (CARDIA)    Difficulty of Paying Living Expenses: Not hard at all  Food Insecurity: No [place] (01/05/2022)   Hunger Vital Sign    Worried About Running Out of Food in the Last Year: Never true    Ran Out of Food in the Last Year: Never true  Transportation Needs: No Transportation Needs (01/05/2022)   PRAPARE - Hydrologist (Medical): No     Lack of Transportation (Non-Medical): No  Physical Activity: Inactive (01/05/2022)   Exercise Vital Sign    Days of Exercise per Week: 0 days    Minutes of Exercise per Session: 0 min  Stress: No Stress Concern Present (01/05/2022)   [place]    Feeling of Stress : Not at all  Social Connections: Moderately Isolated (01/05/2022)   Social Connection and Isolation Panel [NHANES]    Frequency of Communication with Friends and Family: More than three times a week    Frequency of Social Gatherings with Friends and Family: More than three times a week    Attends Religious Services: More than 4 times per year    Active Member of [employer] or Organizations: No    Attends Archivist Meetings: Never    Marital Status: Widowed  Intimate Partner Violence: Not At Risk (01/05/2022)   Humiliation, Afraid, Rape, and Kick questionnaire    Fear of Current or Ex-Partner: No    Emotionally Abused: No    Physically Abused: No    Sexually Abused: No    Review of Systems  All other systems reviewed and are negative.   PHYSICAL EXAMINATION:    BP 110/68   Pulse 66   Wt 124 lb (56.2 kg)   SpO2 98%   BMI 21.97 kg/m     General appearance: alert, cooperative and appears stated age  Pelvic: External genitalia:  no lesions              Urethra:  normal appearing urethra with no masses, tenderness or lesions              Bartholins and Skenes: normal                 Vagina: the pessary was removed and cleaned. No vaginal irritation. The pessary was left out. Her posterior fourchette tore mildly with removal of the pessary.              Cervix: no lesions               Chaperone was present for exam.   1. Pessary maintenance She is doing well. She had the pessary out for several weeks and felt fine. I left the  pessary out, she will replace it if she feels she needs it.  -If she restarts using the pessary, she should f/u in 5/24.  Otherwise return for a B&P in 11/24  2. Vaginal atrophy Continue with vaginal estrogen cream 1 x a week for now

## 2022-06-08 DIAGNOSIS — R5382 Chronic fatigue, unspecified: Secondary | ICD-10-CM | POA: Diagnosis not present

## 2022-06-08 DIAGNOSIS — R200 Anesthesia of skin: Secondary | ICD-10-CM | POA: Diagnosis not present

## 2022-06-08 DIAGNOSIS — H269 Unspecified cataract: Secondary | ICD-10-CM | POA: Diagnosis not present

## 2022-06-08 DIAGNOSIS — N1832 Chronic kidney disease, stage 3b: Secondary | ICD-10-CM | POA: Diagnosis not present

## 2022-06-08 DIAGNOSIS — N814 Uterovaginal prolapse, unspecified: Secondary | ICD-10-CM | POA: Diagnosis not present

## 2022-06-08 DIAGNOSIS — I700 Atherosclerosis of aorta: Secondary | ICD-10-CM | POA: Diagnosis not present

## 2022-06-08 DIAGNOSIS — M810 Age-related osteoporosis without current pathological fracture: Secondary | ICD-10-CM | POA: Diagnosis not present

## 2022-06-08 DIAGNOSIS — E785 Hyperlipidemia, unspecified: Secondary | ICD-10-CM | POA: Diagnosis not present

## 2022-06-08 DIAGNOSIS — G4733 Obstructive sleep apnea (adult) (pediatric): Secondary | ICD-10-CM | POA: Diagnosis not present

## 2022-06-26 DIAGNOSIS — H532 Diplopia: Secondary | ICD-10-CM | POA: Diagnosis not present

## 2022-06-26 DIAGNOSIS — H52203 Unspecified astigmatism, bilateral: Secondary | ICD-10-CM | POA: Diagnosis not present

## 2022-06-26 DIAGNOSIS — H2513 Age-related nuclear cataract, bilateral: Secondary | ICD-10-CM | POA: Diagnosis not present

## 2022-06-28 IMAGING — DX DG CHEST 2V
2 series · 2 of 2 positions shown · non-contrast
Comparison: No priors.

CLINICAL DATA: 79-year-old female with history of chronic fatigue
syndrome.

EXAM:
CHEST - 2 VIEW

[chest pa]
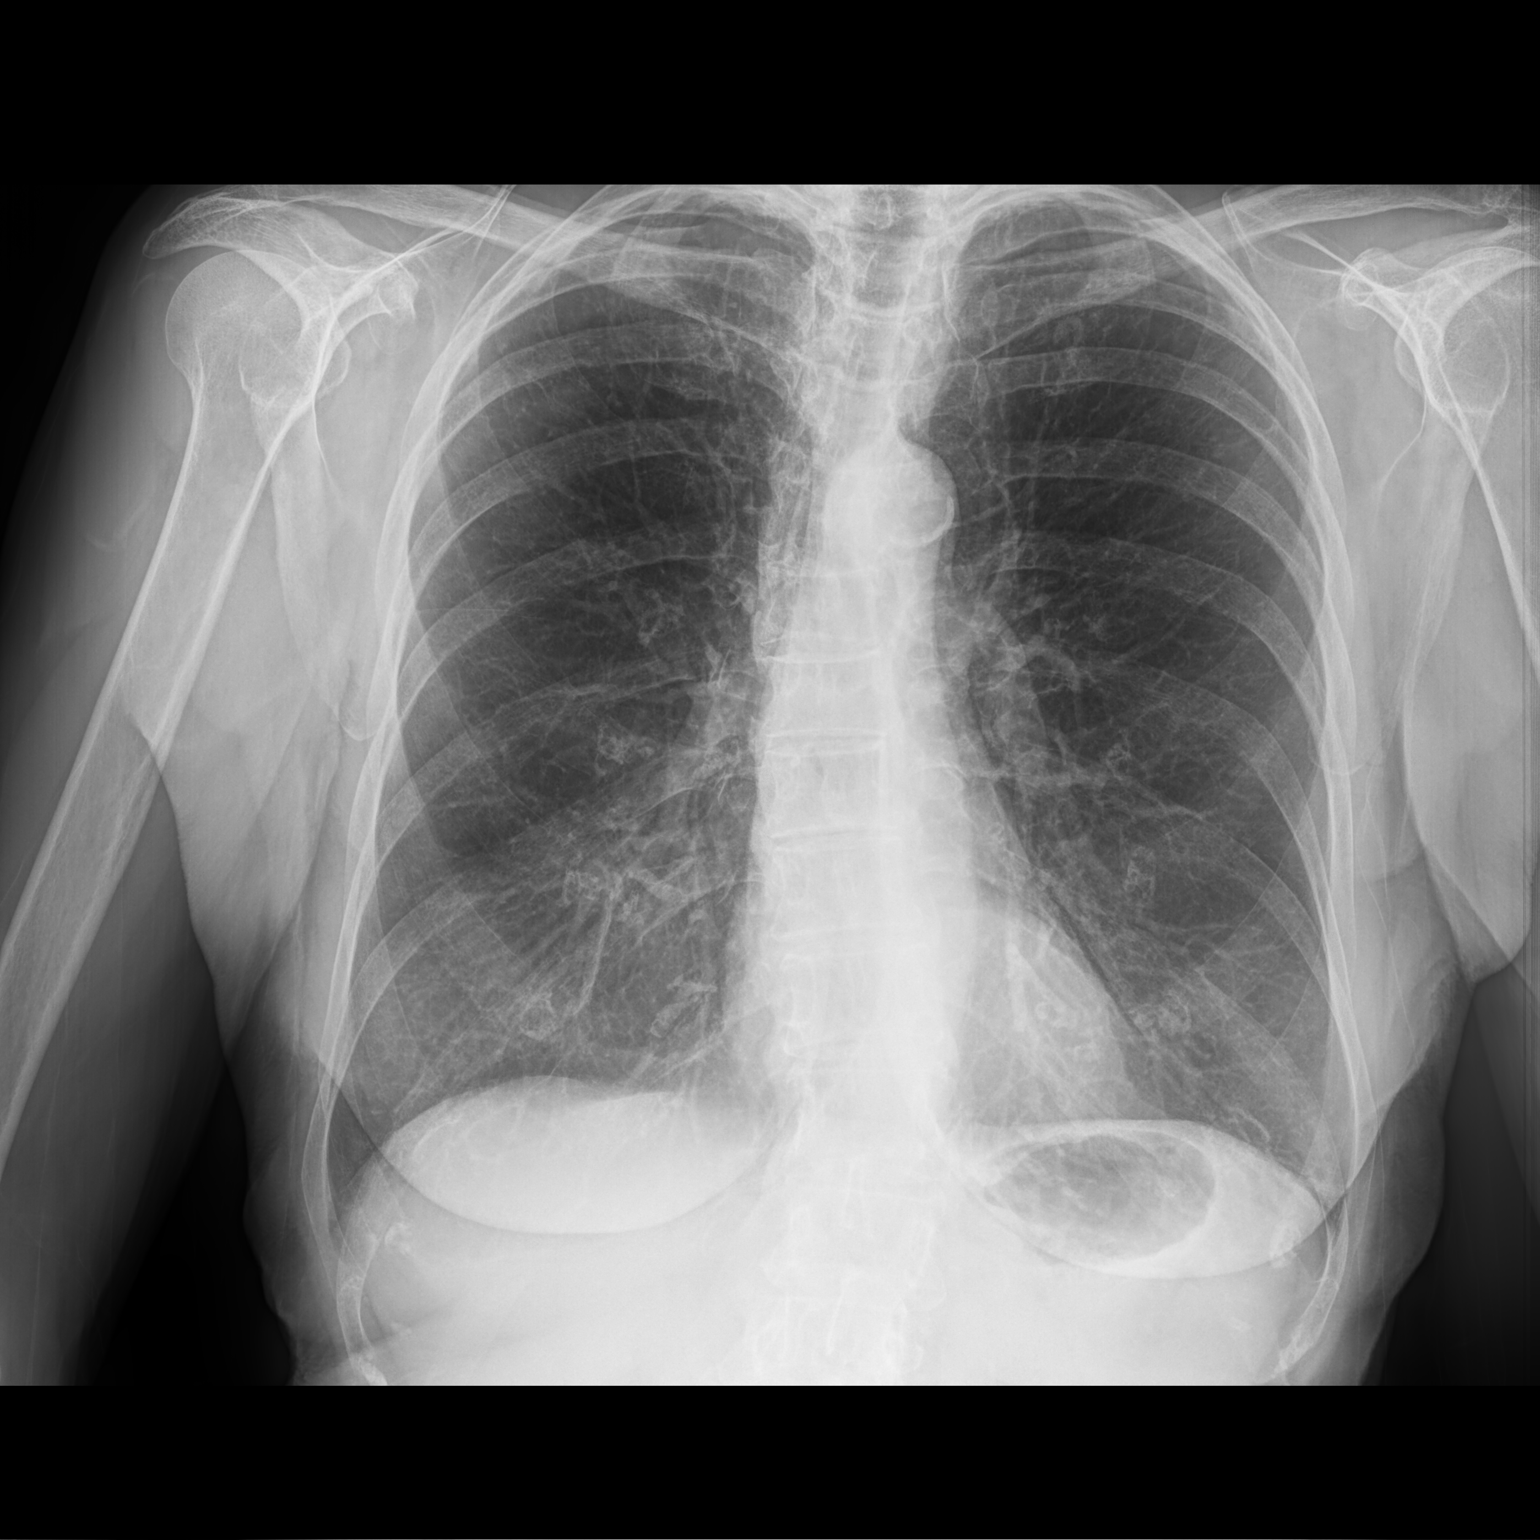

[chest lat]
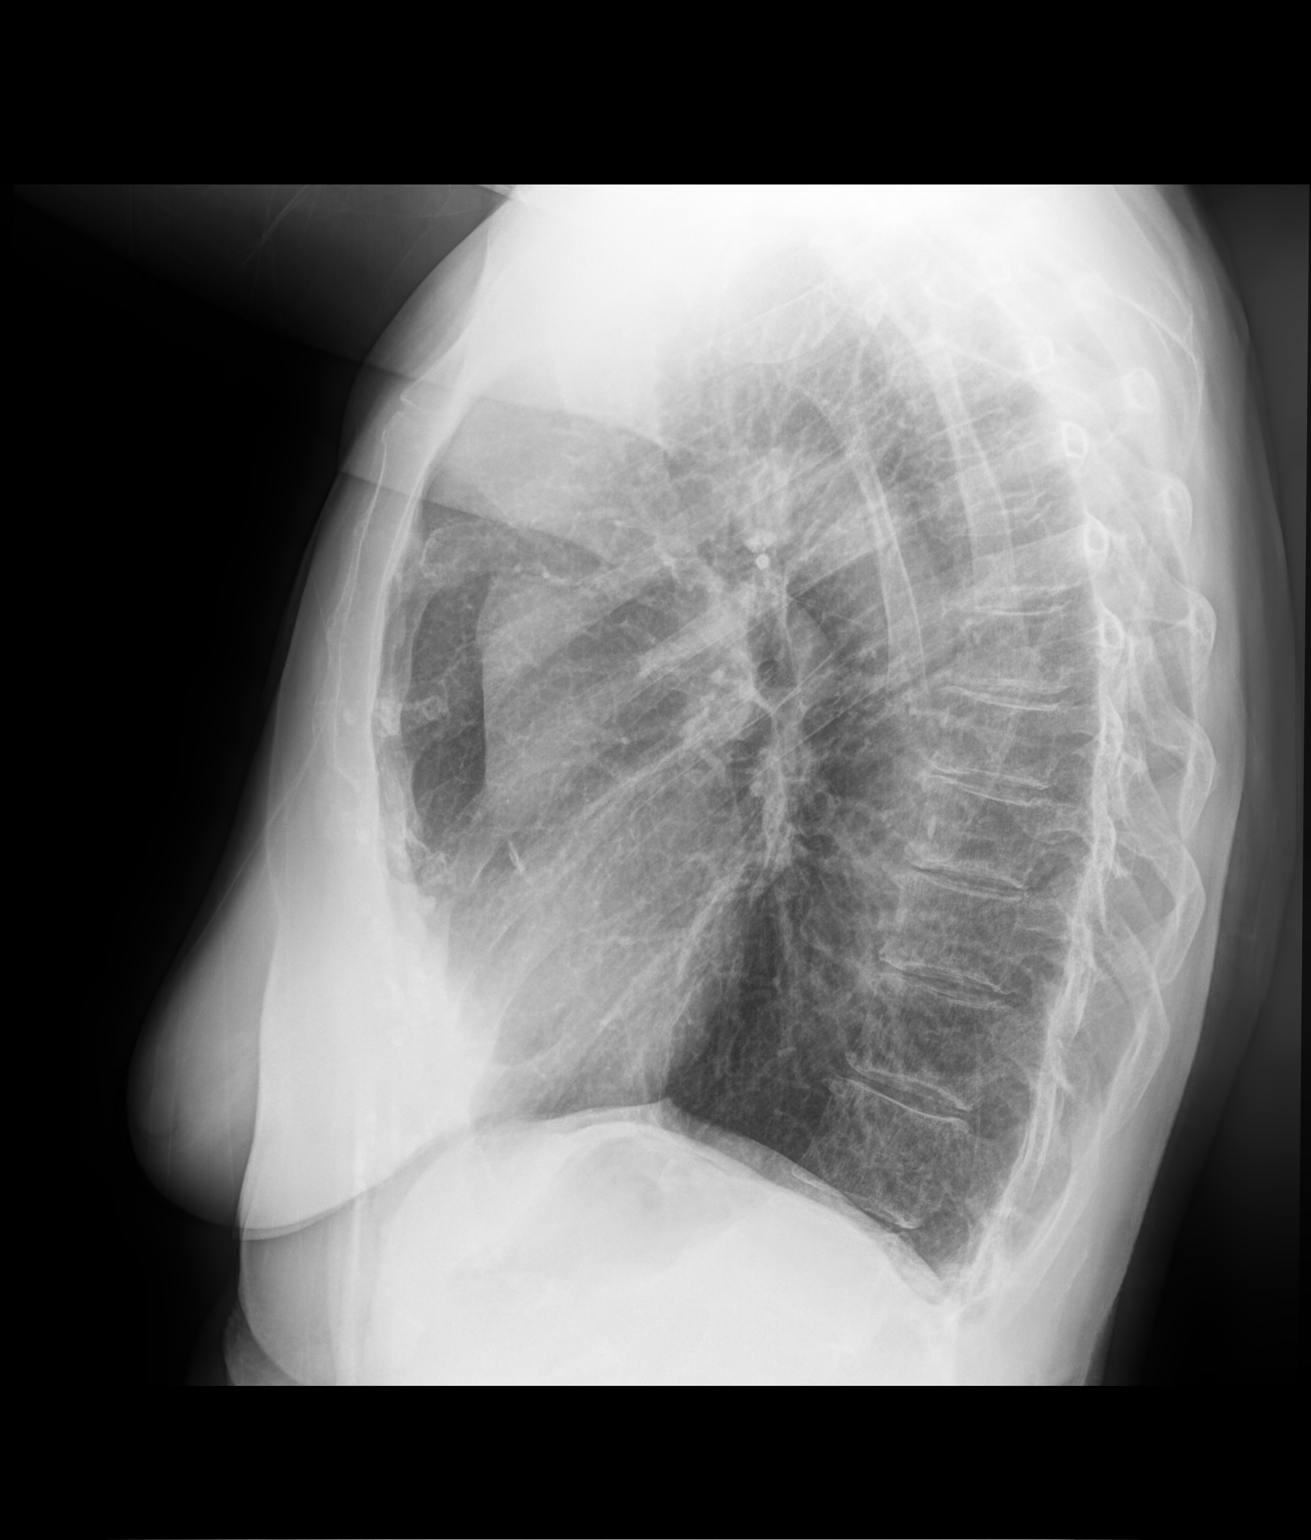

[2 of 2 positions shown; findings below may reference images not displayed]

FINDINGS: Lung volumes are normal. No consolidative airspace disease. No
pleural effusions. No pneumothorax. No pulmonary nodule or mass
noted. Pulmonary vasculature and the cardiomediastinal silhouette
are within normal limits. Atherosclerosis in the thoracic aorta.
IMPRESSION: 1.  No radiographic evidence of acute cardiopulmonary disease.
2. Aortic atherosclerosis.

## 2022-10-03 ENCOUNTER — Telehealth (HOSPITAL_BASED_OUTPATIENT_CLINIC_OR_DEPARTMENT_OTHER): Payer: Self-pay | Admitting: *Deleted

## 2022-10-03 NOTE — Telephone Encounter (Signed)
Called to discuss scheduling the ABI ordered by Dr. Fritzi Mandes.  No answer and no voice mail

## 2022-10-08 NOTE — Telephone Encounter (Signed)
Patient was returning call. Please advie  

## 2022-10-08 NOTE — Telephone Encounter (Signed)
Left message for patient to call and schedule the ABI ordered by [facility]

## 2022-11-15 ENCOUNTER — Other Ambulatory Visit: Payer: Self-pay | Admitting: Family

## 2022-11-16 NOTE — Progress Notes (Signed)
Encounters Encounter Location Date Provider Diagnosis [facility]  [address] [place], [place] [id number] 10/01/2022 Larey Dresser

## 2022-11-26 NOTE — Progress Notes (Signed)
Cardiology Office Note:    Date:  11/27/2022   ID:  Tina Smith, DOB 1941-02-23, MRN [id number]  PCP:  Tina Sellar, NP  Cardiologist:  Tina Red, MD  Referring MD: Tina Sellar, NP   CC: New patient evaluation for PVD  History of Present Illness:    Tina Smith is a 82 y.o. female with a hx of aortic atherosclerosis, peripheral vascular disease, dyslipidemia, and OSA not on CPAP, who is seen as a new consult at the request of Tina Sellar, NP for the evaluation and management of cardiac risk stratification given known PVD.   Referral notes personally reviewed. She was seen by Dr. Harriet Pho 08/28/2022 where she complained of cold feet ongoing for a year. She had a LE arterial ultrasound 09/26/2022 which revealed moderate to severe obstructive disease in her lower extremities bilaterally.  Cardiovascular risk factors: Prior clinical ASCVD: None. She notes that she had rheumatic fever when she was 82 yo. She has been told there was no subsequent damage to her heart.  Comorbid conditions: She denies hypertension, diabetes, chronic kidney disease. She notes that her kidney function is "not up to par".   Metabolic syndrome/Obesity: Her current weight is 126 lbs. Chronic inflammatory conditions: None. Tobacco use history: Never. Family history:  Her mother had a valve replacement, lived to be 20 yo. Her father lived to be 51 yo. Prior cardiac testing and/or incidental findings on other testing: Arterial ultrasound as above. Exercise level:  Going to the [facility] for water aerobics 3 times a week for the past 12 years. Lately, she has noticed increased difficulty with pushing her vacuum. For 30 years she has struggled with chronic fatigue.  She confirms that she had seen her podiatrist in 08/2022 because her feet feel cold and numb. Since her sonogram determined she had LE vascular disease, she was concerned that she may have coronary disease as well so she asked for a  referral to cardiology.  At one time she had experienced some pain in her toe. Does have onychomycosis. She hadn't noticed any discoloration of the toenails as she is usually wearing polish. Denies non-healing wounds, no discoloration of toes, no significant muscle cramps in legs with walking.  Years ago she had stopped taking her statin, she does not recall if she had any adverse effects. As of 01/2022 her LDL was 191. Did take 81 mg ASA for a time in the past. Discontinued as she didn't have known disease at the time. No recent bleeding issues.  In August 2022, she had a mild case of Covid-19. She has had all but the latest vaccinations.  She denies any palpitations, chest pain, shortness of breath, or peripheral edema. No lightheadedness, headaches, syncope, orthopnea, or PND.  Past Medical History:  Diagnosis Date   Deafness, partial    Diverticulosis 02/26/2012   Dyslipidemia 07/05/2010   Qualifier: Diagnosis of  By: Amador Cunas  MD, Janett Labella    Sleep apnea, does not tolerate CPAP     Past Surgical History:  Procedure Laterality Date   BREAST EXCISIONAL BIOPSY Left 1990   BENIGN   COLONOSCOPY  2003, 2013   CYSTOCELE REPAIR  04/2011   TONSILLECTOMY  1944    Current Medications: Current Outpatient Medications on File Prior to Visit  Medication Sig   Ascorbic Acid (VITAMIN C PO) Take by mouth.   Calcium Carbonate (CALCIUM 600 PO) Take 600 mg by mouth daily.   cyanocobalamin (VITAMIN B12) 1000 MCG tablet Take 1,000 mcg by mouth  daily.   Glucosamine-Chondroit-Vit C-Mn (GLUCOSAMINE-CHONDROITIN) TABS Take 1 tablet by mouth in the morning and at bedtime.   MAGNESIUM PO Take by mouth.   Multiple Vitamin (MULTIVITAMIN) tablet Take 1 tablet by mouth daily.   PSYLLIUM HUSK PO Take 610 mg by mouth as needed.   No current facility-administered medications on file prior to visit.     Allergies:   Patient has no known allergies.   Social History   Tobacco Use   Smoking status: Never    Smokeless tobacco: Never  Vaping Use   Vaping Use: Never used  Substance Use Topics   Alcohol use: Yes    Comment: occasional   Drug use: No    Family History: family history includes Colitis in her mother; Heart disease in her mother. There is no history of Colon cancer, Stomach cancer, Colon polyps, Esophageal cancer, Diabetes, or Kidney disease.  ROS:   Please see the history of present illness.  Additional pertinent ROS: Constitutional: Negative for chills, fever, night sweats, unintentional weight loss. Positive for fatigue. HENT: Negative for ear pain and hearing loss.   Eyes: Negative for loss of vision and eye pain.  Respiratory: Negative for cough, sputum, wheezing.   Cardiovascular: See HPI. Gastrointestinal: Negative for abdominal pain, melena, and hematochezia.  Genitourinary: Negative for dysuria and hematuria.  Musculoskeletal: Negative for falls and myalgias.  Skin: Negative for itching and rash.  Neurological: Negative for focal weakness, focal sensory changes and loss of consciousness.  Endo/Heme/Allergies: Does not bruise/bleed easily.     EKGs/Labs/Other Studies Reviewed:    The following studies were reviewed today:  LE Arterial Doppler  09/26/2022: Conclusions: Consider vascular consultation if clinically indicated.  Findings: Right: Moderate to severe obstructive disease of femoral and popliteal arteries >75%.  Moderate to severe obstructive disease of tibial arteries >75%.  Left:: Moderate to severe obstructive disease of femoral and popliteal arteries >75%.  Moderate to severe obstructive disease of tibial arteries >75%.  Chest X-Ray  08/30/2020: FINDINGS: Lung volumes are normal. No consolidative airspace disease. No pleural effusions. No pneumothorax. No pulmonary nodule or mass noted. Pulmonary vasculature and the cardiomediastinal silhouette are within normal limits. Atherosclerosis in the thoracic aorta.   IMPRESSION: 1.  No  radiographic evidence of acute cardiopulmonary disease. 2. Aortic atherosclerosis.  EKG:  EKG is personally reviewed.   11/27/2022:  NSR at 75 bpm  Recent Labs: 01/08/2022: ALT 13; BUN 19; Creatinine, Ser 1.32; Hemoglobin 13.6; Platelets 230.0; Potassium 4.0; Sodium 142; TSH 1.74   Recent Lipid Panel    Component Value Date/Time   CHOL 265 (H) 01/08/2022 0957   TRIG 164.0 (H) 01/08/2022 0957   HDL 41.40 01/08/2022 0957   CHOLHDL 6 01/08/2022 0957   VLDL 32.8 01/08/2022 0957   LDLCALC 191 (H) 01/08/2022 0957   LDLCALC 196 (H) 01/29/2020 1416   LDLDIRECT 204.0 04/15/2018 1326    Physical Exam:    VS:  BP 118/70 (BP Location: Right Arm, Patient Position: Sitting, Cuff Size: Normal)   Pulse 75   Ht 5\' 3"  (1.6 m)   Wt 126 lb (57.2 kg)   BMI 22.32 kg/m     Wt Readings from Last 3 Encounters:  11/27/22 126 lb (57.2 kg)  05/17/22 124 lb (56.2 kg)  03/27/22 124 lb 9.6 oz (56.5 kg)    GEN: Well nourished, well developed in no acute distress HEENT: Normal, moist mucous membranes NECK: No JVD CARDIAC: regular rhythm, normal S1 and S2, no rubs or gallops. No murmur. VASCULAR:  Radial pulses 2+ bilaterally, DP pulses 1+ bilaterally. No carotid bruits RESPIRATORY:  Clear to auscultation without rales, wheezing or rhonchi  ABDOMEN: Soft, non-tender, non-distended MUSCULOSKELETAL:  Ambulates independently SKIN: Warm and dry, no edema NEUROLOGIC:  Alert and oriented x 3. No focal neuro deficits noted. PSYCHIATRIC:  Normal affect    ASSESSMENT:    1. PAD (peripheral artery disease) (HCC)   2. Pure hypercholesterolemia   3. Cardiac risk counseling   4. Counseling on health promotion and disease prevention   5. Encounter to discuss test results   6. Therapeutic drug monitoring    PLAN:    PAD Hypercholesterolemia -reviewed test results -discussed guideline recommended medications -start aspirin, statin. Recheck labs in 3 mos -reviewed Smith flag warning signs that need immediate  medical attention  Cardiac risk counseling and prevention recommendations: -recommend heart healthy/Mediterranean diet, with whole grains, fruits, vegetable, fish, lean meats, nuts, and olive oil. Limit salt. -recommend moderate walking, 3-5 times/week for 30-50 minutes each session. Aim for at least 150 minutes.week. Goal should be pace of 3 miles/hours, or walking 1.5 miles in 30 minutes -recommend avoidance of tobacco products. Avoid excess alcohol.  Plan for follow up: 3 months or sooner as needed.  Tina Red, MD, PhD, [facility]  [facility]    Medication Adjustments/Labs and Tests Ordered: Current medicines are reviewed at length with the patient today.  Concerns regarding medicines are outlined above.   Orders Placed This Encounter  Procedures   Lipid panel   Hepatic function panel   EKG 12-Lead   Meds ordered this encounter  Medications   aspirin EC 81 MG tablet    Sig: Take 1 tablet (81 mg total) by mouth daily. Swallow whole.    Dispense:  90 tablet    Refill:  3   rosuvastatin (CRESTOR) 20 MG tablet    Sig: Take 1 tablet (20 mg total) by mouth daily.    Dispense:  90 tablet    Refill:  3   Patient Instructions  Medication Instructions:  START ROSUVASTATIN 20 MG DAILY   START ASPIRIN 81 MG DAILY   *If you need a refill on your cardiac medications before your next appointment, please call your pharmacy*  Lab Work: FASTING LP/LFT'S IN 3 MONTHS ABOUT 1 WEEK PRIOR TO FOLLOW UP   If you have labs (blood work) drawn today and your tests are completely normal, you will receive your results only by: MyChart Message (if you have MyChart) OR A paper copy in the mail If you have any lab test that is abnormal or we need to change your treatment, we will call you to review the results.  Testing/Procedures: NONE  Follow-Up: At [facility], you and your health needs are our priority.  As part of our continuing mission to provide you with  exceptional heart care, we have created designated Provider Care Teams.  These Care Teams include your primary Cardiologist (physician) and Advanced Practice Providers (APPs -  Physician Assistants and Nurse Practitioners) who all work together to provide you with the care you need, when you need it.  We recommend signing up for the patient portal called "MyChart".  Sign up information is provided on this After Visit Summary.  MyChart is used to connect with patients for Virtual Visits (Telemedicine).  Patients are able to view lab/test results, encounter notes, upcoming appointments, etc.  Non-urgent messages can be sent to your provider as well.   To learn more about what you can do  with MyChart, go to [email or web address].    Your next appointment:   3 month(s) (ABOUT 1 WEEK AFTER LABS)  Provider:   Jodelle Red, MD      [facility],acting as a scribe for Tina Red, MD.,have documented all relevant documentation on the behalf of Tina Red, MD,as directed by  Tina Red, MD while in the presence of Tina Red, MD.  I, Tina Red, MD, have reviewed all documentation for this visit. The documentation on 01/10/23 for the exam, diagnosis, procedures, and orders are all accurate and complete.   Signed, Tina Red, MD PhD 11/27/2022 12:00 PM    [facility]

## 2022-11-27 ENCOUNTER — Encounter (HOSPITAL_BASED_OUTPATIENT_CLINIC_OR_DEPARTMENT_OTHER): Payer: Self-pay | Admitting: Cardiology

## 2022-11-27 ENCOUNTER — Ambulatory Visit (INDEPENDENT_AMBULATORY_CARE_PROVIDER_SITE_OTHER): Payer: Medicare Other | Admitting: Cardiology

## 2022-11-27 VITALS — BP 118/70 | HR 75 | Ht 63.0 in | Wt 126.0 lb

## 2022-11-27 DIAGNOSIS — I739 Peripheral vascular disease, unspecified: Secondary | ICD-10-CM

## 2022-11-27 DIAGNOSIS — Z712 Person consulting for explanation of examination or test findings: Secondary | ICD-10-CM

## 2022-11-27 DIAGNOSIS — Z7189 Other specified counseling: Secondary | ICD-10-CM

## 2022-11-27 DIAGNOSIS — Z5181 Encounter for therapeutic drug level monitoring: Secondary | ICD-10-CM

## 2022-11-27 DIAGNOSIS — E7800 Pure hypercholesterolemia, unspecified: Secondary | ICD-10-CM

## 2022-11-27 MED ORDER — ASPIRIN 81 MG PO TBEC: 81.0000 mg | DELAYED_RELEASE_TABLET | Freq: Every day | ORAL | 3 refills | Status: DC

## 2022-11-27 MED ORDER — ROSUVASTATIN CALCIUM 20 MG PO TABS
20.0000 mg | ORAL_TABLET | Freq: Every day | ORAL | 3 refills | Status: DC
Start: 2022-11-27 — End: 2023-11-14

## 2022-11-27 NOTE — Patient Instructions (Addendum)
Medication Instructions:  START ROSUVASTATIN 20 MG DAILY   START ASPIRIN 81 MG DAILY   *If you need a refill on your cardiac medications before your next appointment, please call your pharmacy*  Lab Work: FASTING LP/LFT'S IN 3 MONTHS ABOUT 1 WEEK PRIOR TO FOLLOW UP   If you have labs (blood work) drawn today and your tests are completely normal, you will receive your results only by: MyChart Message (if you have MyChart) OR A paper copy in the mail If you have any lab test that is abnormal or we need to change your treatment, we will call you to review the results.  Testing/Procedures: NONE  Follow-Up: At [facility], you and your health needs are our priority.  As part of our continuing mission to provide you with exceptional heart care, we have created designated Provider Care Teams.  These Care Teams include your primary Cardiologist (physician) and Advanced Practice Providers (APPs -  Physician Assistants and Nurse Practitioners) who all work together to provide you with the care you need, when you need it.  We recommend signing up for the patient portal called "MyChart".  Sign up information is provided on this After Visit Summary.  MyChart is used to connect with patients for Virtual Visits (Telemedicine).  Patients are able to view lab/test results, encounter notes, upcoming appointments, etc.  Non-urgent messages can be sent to your provider as well.   To learn more about what you can do with MyChart, go to [email or web address].    Your next appointment:   3 month(s) (ABOUT 1 WEEK AFTER LABS)  Provider:   Jodelle Red, MD

## 2023-02-26 ENCOUNTER — Ambulatory Visit (HOSPITAL_BASED_OUTPATIENT_CLINIC_OR_DEPARTMENT_OTHER): Payer: Medicare Other | Admitting: Cardiology

## 2023-02-28 ENCOUNTER — Ambulatory Visit (HOSPITAL_BASED_OUTPATIENT_CLINIC_OR_DEPARTMENT_OTHER): Payer: Medicare Other | Admitting: Cardiology

## 2023-10-07 ENCOUNTER — Other Ambulatory Visit (HOSPITAL_BASED_OUTPATIENT_CLINIC_OR_DEPARTMENT_OTHER): Payer: Self-pay | Admitting: Geriatric Medicine

## 2023-10-07 DIAGNOSIS — R221 Localized swelling, mass and lump, neck: Secondary | ICD-10-CM

## 2023-10-28 ENCOUNTER — Other Ambulatory Visit (HOSPITAL_BASED_OUTPATIENT_CLINIC_OR_DEPARTMENT_OTHER): Payer: Self-pay

## 2023-10-28 MED ORDER — AMOXICILLIN 875 MG PO TABS
875.0000 mg | ORAL_TABLET | Freq: Two times a day (BID) | ORAL | 0 refills | Status: DC
  Filled 2023-10-28 (×2): qty 20, 10d supply, fill #0

## 2023-11-14 ENCOUNTER — Encounter: Payer: Self-pay | Admitting: Family

## 2023-11-14 ENCOUNTER — Ambulatory Visit (INDEPENDENT_AMBULATORY_CARE_PROVIDER_SITE_OTHER): Admitting: Family

## 2023-11-14 VITALS — BP 121/75 | HR 83 | Temp 97.8°F | Wt 129.2 lb

## 2023-11-14 DIAGNOSIS — R590 Localized enlarged lymph nodes: Secondary | ICD-10-CM | POA: Diagnosis not present

## 2023-11-14 NOTE — Progress Notes (Signed)
 Patient ID: Tina Smith, female    DOB: Sep 02, 1940, 83 y.o.   MRN: [id number]  Chief Complaint  Patient presents with   Mass    Pt c/o lump on left side of neck, present for 9 months. Pt denies any pain.   Discussed the use of AI scribe software for clinical note transcription with the patient, who gave verbal consent to proceed.  History of Present Illness Tina Smith is an 83 year old female who presents with a persistent lump in the neck.  The lump in her neck, approximately the size of a walnut, was first noticed around the time of a dental procedure in August of the previous year. Dental x-rays did not address the lump, and a 10-day course of antibiotics from a second dentist was ineffective. She experiences chronic fatigue, cold and numb feet, and has been evaluated for vascular disease with a flow test and sonogram indicating vascular issues. She discontinued a prescribed statin due to shoulder pain. An EKG was normal. She has a history of mild sleep apnea and previously used a CPAP machine. Her nose becomes congested at night, and she has been using a humidifier for about a month. She participates in water aerobics twice a week and uses a heating pad for cold feet.  She reports going to [facility] senior care clinic as she thought a good idea since they only do senior care, but she found it to not be any different.  Assessment & Plan Chronic Fatigue Chronic fatigue with peripheral vascular disease and suspected sleep apnea. Statins discontinued due to shoulder pain. Prioritizes quality of life over medication side effects. - Encourage continued lifestyle modifications such as water aerobics, daily walking, social interactions.  Peripheral Vascular Disease - Peripheral vascular disease confirmed by sonogram. Statins discontinued due to side effects. Symptoms managed with lifestyle modifications. - Continue lifestyle modifications to manage symptoms.  Sleep Apnea (suspected) Suspected  sleep apnea due to chronic fatigue. Previous sleep study inconclusive due to inability to sleep. History of mild sleep apnea diagnosed 20 years ago. Declined home sleep study, considered low risk. - Consider future evaluation if symptoms worsen.  Lymphadenopathy Persistent left neck lymphadenopathy, walnut-sized lump since August last year. Differential includes lymph node, cyst, or benign conditions. Ultrasound planned to evaluate and determine biopsy necessity. - Order ultrasound of the neck at [facility] facility. - Follow up on ultrasound results and consider biopsy if indicated.   Subjective:     Outpatient Medications Prior to Visit  Medication Sig Dispense Refill   Ascorbic Acid (VITAMIN C PO) Take by mouth.     Calcium  Carbonate (CALCIUM  600 PO) Take 600 mg by mouth daily.     co-enzyme Q-10 30 MG capsule Take 30 mg by mouth 3 (three) times daily.     cyanocobalamin  (VITAMIN B12) 1000 MCG tablet Take 1,000 mcg by mouth daily.     Glucosamine-Chondroit-Vit C-Mn (GLUCOSAMINE-CHONDROITIN) TABS Take 1 tablet by mouth in the morning and at bedtime.     MAGNESIUM PO Take by mouth.     Multiple Vitamin (MULTIVITAMIN) tablet Take 1 tablet by mouth daily.     PSYLLIUM HUSK PO Take 610 mg by mouth as needed.     TURMERIC PO Take by mouth.     amoxicillin  (AMOXIL ) 875 MG tablet Take 1 tablet (875 mg total) by mouth every 12 (twelve) hours. (Patient not taking: Reported on 11/14/2023) 20 tablet 0   aspirin  EC 81 MG tablet Take 1 tablet (81 mg total) by  mouth daily. Swallow whole. (Patient not taking: Reported on 11/14/2023) 90 tablet 3   rosuvastatin  (CRESTOR ) 20 MG tablet Take 1 tablet (20 mg total) by mouth daily. 90 tablet 3   No facility-administered medications prior to visit.   Past Medical History:  Diagnosis Date   Deafness, partial    Diverticulosis 02/26/2012   Dyslipidemia 07/05/2010   Qualifier: Diagnosis of  By: Minnette Amato  MD, Ronie Cohen    Sleep apnea, does not tolerate CPAP     Past Surgical History:  Procedure Laterality Date   BREAST EXCISIONAL BIOPSY Left 1990   BENIGN   COLONOSCOPY  2003, 2013   CYSTOCELE REPAIR  04/2011   TONSILLECTOMY  1944   No Known Allergies    Objective:    Physical Exam Vitals and nursing note reviewed.  Constitutional:      Appearance: Normal appearance.  Cardiovascular:     Rate and Rhythm: Normal rate and regular rhythm.  Pulmonary:     Effort: Pulmonary effort is normal.     Breath sounds: Normal breath sounds.  Musculoskeletal:        General: Normal range of motion.  Lymphadenopathy:     Head:     Right side of head: No submandibular, tonsillar, preauricular, posterior auricular or occipital adenopathy.     Left side of head: No submandibular, tonsillar, preauricular, posterior auricular or occipital adenopathy.     Cervical: Cervical adenopathy present.     Left cervical: Superficial cervical adenopathy present.  Skin:    General: Skin is warm and dry.  Neurological:     Mental Status: She is alert.  Psychiatric:        Mood and Affect: Mood normal.        Behavior: Behavior normal.    BP 121/75 (BP Location: Left Arm, Cuff Size: Normal)   Pulse 83   Temp 97.8 F (36.6 C) (Temporal)   Wt 129 lb 3.2 oz (58.6 kg)   SpO2 96%   BMI 22.89 kg/m  Wt Readings from Last 3 Encounters:  11/14/23 129 lb 3.2 oz (58.6 kg)  11/27/22 126 lb (57.2 kg)  05/17/22 124 lb (56.2 kg)      Versa Gore, NP

## 2023-11-19 ENCOUNTER — Ambulatory Visit (HOSPITAL_BASED_OUTPATIENT_CLINIC_OR_DEPARTMENT_OTHER)
Admission: RE | Admit: 2023-11-19 | Discharge: 2023-11-19 | Disposition: A | Discharge status: Home / Self Care | Source: Ambulatory Visit | Attending: Family | Admitting: Family

## 2023-11-19 DIAGNOSIS — R590 Localized enlarged lymph nodes: Secondary | ICD-10-CM | POA: Insufficient documentation

## 2023-11-22 ENCOUNTER — Ambulatory Visit: Payer: Self-pay | Admitting: Family

## 2023-11-22 DIAGNOSIS — R590 Localized enlarged lymph nodes: Secondary | ICD-10-CM

## 2023-11-25 NOTE — Progress Notes (Signed)
 Myrlene Asper, DO  Rhylen Shaheen; [facility] Procedure Requests OK for attempt US  guided FNA of cervical node.  US  11/19/2023  If not enlarged/suspicious, or if no correlate at time of US , could be limited US  only.  Mabel Savage       Previous Messages    ----- Message ----- From: Alfonzo Arca Sent: 11/25/2023   9:09 AM EDT To: Rosetta Cons, MD; Joelle Musca; * Subject: US  core biopsy                                Procedure : US  core Biopsy  Reason: enlarged cervical lymph node, present for 8 months Dx: Lymphadenopathy of left cervical region [R59.0 (ICD-10-CM)]    History : US  soft tissue head and neck  Provider : Versa Gore, NP  Provider contact : [phone]

## 2023-12-16 ENCOUNTER — Other Ambulatory Visit: Payer: Self-pay | Admitting: Student

## 2023-12-17 ENCOUNTER — Ambulatory Visit (HOSPITAL_COMMUNITY)
Admission: RE | Admit: 2023-12-17 | Discharge: 2023-12-17 | Disposition: A | Discharge status: Home / Self Care | Source: Ambulatory Visit | Attending: Family | Admitting: Family

## 2023-12-17 ENCOUNTER — Encounter (HOSPITAL_COMMUNITY): Payer: Self-pay

## 2023-12-17 DIAGNOSIS — R590 Localized enlarged lymph nodes: Secondary | ICD-10-CM | POA: Insufficient documentation

## 2023-12-17 MED ORDER — LIDOCAINE HCL 1 % IJ SOLN
INTRAMUSCULAR | Status: AC
  Filled 2023-12-17: qty 20

## 2023-12-17 MED ORDER — SODIUM CHLORIDE 0.9 % IV SOLN: INTRAVENOUS | Status: DC

## 2023-12-17 NOTE — Progress Notes (Signed)
 Patient ID: Tina Smith, female   DOB: Jan 21, 1941, 83 y.o.   MRN: [id number] Patient presents to radiology department today for image guided left cervical lymph node biopsy.Risks and benefits of procedure was discussed with the patient  including, but not limited to bleeding, infection, damage to adjacent structures or low yield requiring additional tests or possible cancellation of biopsy.   All of the questions were answered and there is agreement to proceed.  Consent signed and in chart.  Procedure will be done using local anesthesia only.

## 2023-12-17 NOTE — Discharge Instructions (Signed)
 Verbal instructions given over phone at 1730  Pt left unit without speaking with RN post procedure.

## 2023-12-17 NOTE — Procedures (Signed)
 Pre procedural Dx: Persistent left neck lymph node  Post procedural Dx: Same  Technically successful U/S guided FNA lymph node 9mm.  Given size and location FNA was performed over core sampling   EBL: None.   Complications: None immediate.   Zettie Hillock, MD Pager #: [phone]

## 2023-12-18 ENCOUNTER — Other Ambulatory Visit: Payer: Self-pay | Admitting: Family

## 2023-12-18 DIAGNOSIS — R590 Localized enlarged lymph nodes: Secondary | ICD-10-CM

## 2023-12-23 ENCOUNTER — Ambulatory Visit: Payer: Self-pay | Admitting: Family

## 2024-01-16 ENCOUNTER — Ambulatory Visit

## 2024-01-16 ENCOUNTER — Ambulatory Visit: Admitting: Family

## 2024-01-16 VITALS — BP 130/74 | HR 74 | Temp 97.8°F | Ht 63.0 in | Wt 128.0 lb

## 2024-01-16 DIAGNOSIS — G9332 Myalgic encephalomyelitis/chronic fatigue syndrome: Secondary | ICD-10-CM

## 2024-01-16 DIAGNOSIS — Z0000 Encounter for general adult medical examination without abnormal findings: Secondary | ICD-10-CM

## 2024-01-16 DIAGNOSIS — M810 Age-related osteoporosis without current pathological fracture: Secondary | ICD-10-CM | POA: Diagnosis not present

## 2024-01-16 LAB — COMPREHENSIVE METABOLIC PANEL WITH GFR
ALT: 15 U/L (ref 0–35)
AST: 18 U/L (ref 0–37)
Albumin: 4.1 g/dL (ref 3.5–5.2)
Alkaline Phosphatase: 80 U/L (ref 39–117)
BUN: 21 mg/dL (ref 6–23)
CO2: 30 meq/L (ref 19–32)
Calcium: 9.9 mg/dL (ref 8.4–10.5)
Chloride: 106 meq/L (ref 96–112)
Creatinine, Ser: 1.35 mg/dL — ABNORMAL HIGH (ref 0.40–1.20)
GFR: 36.19 mL/min — ABNORMAL LOW (ref >60.00)
Glucose, Bld: 83 mg/dL (ref 70–99)
Potassium: 4.4 meq/L (ref 3.5–5.1)
Sodium: 143 meq/L (ref 135–145)
Total Bilirubin: 0.4 mg/dL (ref 0.2–1.2)
Total Protein: 6.7 g/dL (ref 6.0–8.3)

## 2024-01-16 LAB — CBC WITH DIFFERENTIAL/PLATELET
Basophils Absolute: 0.1 K/uL (ref 0.0–0.1)
Basophils Relative: 1.7 % (ref 0.0–3.0)
Eosinophils Absolute: 0.2 K/uL (ref 0.0–0.7)
Eosinophils Relative: 3.1 % (ref 0.0–5.0)
HCT: 41.9 % (ref 36.0–46.0)
Hemoglobin: 13.9 g/dL (ref 12.0–15.0)
Lymphocytes Relative: 33.6 % (ref 12.0–46.0)
Lymphs Abs: 2 K/uL (ref 0.7–4.0)
MCHC: 33 g/dL (ref 30.0–36.0)
MCV: 90.4 fl (ref 78.0–100.0)
Monocytes Absolute: 0.4 K/uL (ref 0.1–1.0)
Monocytes Relative: 6.7 % (ref 3.0–12.0)
Neutro Abs: 3.2 K/uL (ref 1.4–7.7)
Neutrophils Relative %: 54.9 % (ref 43.0–77.0)
Platelets: 289 K/uL (ref 150.0–400.0)
RBC: 4.64 Mil/uL (ref 3.87–5.11)
RDW: 13.8 % (ref 11.5–15.5)
WBC: 5.8 K/uL (ref 4.0–10.5)

## 2024-01-16 LAB — LIPID PANEL
Cholesterol: 289 mg/dL — ABNORMAL HIGH (ref 0–200)
HDL: 44.3 mg/dL (ref >39.00)
LDL Cholesterol: 207 mg/dL — ABNORMAL HIGH (ref 0–99)
NonHDL: 245.13
Total CHOL/HDL Ratio: 7
Triglycerides: 190 mg/dL — ABNORMAL HIGH (ref 0.0–149.0)
VLDL: 38 mg/dL (ref 0.0–40.0)

## 2024-01-16 LAB — VITAMIN D 25 HYDROXY (VIT D DEFICIENCY, FRACTURES): VITD: 59.8 ng/mL (ref 30.00–100.00)

## 2024-01-16 LAB — TSH: TSH: 2.44 u[IU]/mL (ref 0.35–5.50)

## 2024-01-16 NOTE — Progress Notes (Signed)
 Phone [phone]  Subjective:   Patient is a 83 y.o. female presenting for annual physical.    Chief Complaint  Patient presents with   Annual Exam  Discussed the use of AI scribe software for clinical note transcription with the patient, who gave verbal consent to proceed.  History of Present Illness Emanuel CHANTAE SOO is an 83 year old female with osteoporosis who presents for an annual physical.  Constitutional symptoms and post-acute sequelae of sars-cov-2 infection (long covid) - Persistent feeling of being fatigued for the past three years, with significant decline in the last year - Attributes decline to COVID vaccine on 06/20/2021 - Reluctant to receive further COVID vaccinations  Rectal bleeding - Recent episode of blood in the toilet bowl after straining during a bowel movement - Stools generally normal and soft - No associated pain or itching - Last colonoscopy nine years ago, established with [facility], Dr Avram - No history of cancer or anemia  Osteoporosis and supplement use - Diagnosed osteoporosis, last DEXA in 2022, femur, -2.7, and Osteopenia no medication - Takes daily supplements: magnesium, vitamin D , vitamin C, B12, glucosamine, chondroitin, and a multivitamin  Ocular symptoms - Gritty sensation in the eyes described as 'sleepy bumps' present all day - No redness, burning, or itching - History of double vision in 2022, evaluated with MRI and blood work - Cataracts under monitoring, no surgery performed  Nasal congestion - Nasal congestion, predominantly unilateral - Uses saline spray without relief - No use of Flonase or Nasacort - No nasal itching  See problem oriented charting- ROS- full  review of systems was completed and negative except for: what is noted in HPI above.  The following were reviewed and entered/updated in epic: Past Medical History:  Diagnosis Date   Deafness, partial    Diverticulosis 02/26/2012   Dyslipidemia 07/05/2010    Qualifier: Diagnosis of  By: Jame  MD, Maude FALCON    Sleep apnea, does not tolerate CPAP    Patient Active Problem List   Diagnosis Date Noted   Bowel habit changes 03/27/2022   Numbness 01/08/2022   Cystocele, unspecified 12/11/2021   Chronic fatigue syndrome 11/10/2020   Chronic kidney disease, stage 3 unspecified (HCC) 11/10/2020   Hyperlipidemia 11/10/2020   Osteoporosis 11/10/2020   H/O: rheumatic fever 01/29/2020   Uterine prolapse 01/29/2020   Osteopenia 02/03/2013   History of colonic polyps 03/05/2012   Osteoarthritis 07/05/2010   Past Surgical History:  Procedure Laterality Date   BREAST EXCISIONAL BIOPSY Left 1990   BENIGN   COLONOSCOPY  2003, 2013   CYSTOCELE REPAIR  04/2011   TONSILLECTOMY  1944    Family History  Problem Relation Age of Onset   Heart disease Mother    Colitis Mother    Colon cancer Neg Hx    Stomach cancer Neg Hx    Colon polyps Neg Hx    Esophageal cancer Neg Hx    Diabetes Neg Hx    Kidney disease Neg Hx     Medications- reviewed and updated Current Outpatient Medications  Medication Sig Dispense Refill   Ascorbic Acid (VITAMIN C PO) Take by mouth.     Calcium  Carbonate (CALCIUM  600 PO) Take 600 mg by mouth daily.     co-enzyme Q-10 30 MG capsule Take 30 mg by mouth 3 (three) times daily.     cyanocobalamin  (VITAMIN B12) 1000 MCG tablet Take 1,000 mcg by mouth daily.     Glucosamine-Chondroit-Vit C-Mn (GLUCOSAMINE-CHONDROITIN) TABS Take 1 tablet by  mouth in the morning and at bedtime.     MAGNESIUM PO Take by mouth.     Multiple Vitamin (MULTIVITAMIN) tablet Take 1 tablet by mouth daily.     No current facility-administered medications for this visit.    Allergies-reviewed and updated No Known Allergies  Social History   Social History Narrative   Widowed, 1 son and 1 daughter   Retired   No caffeine    Objective:  BP 130/74   Pulse 74   Temp 97.8 F (36.6 C) (Temporal)   Ht 5' 3 (1.6 m)   Wt 128 lb (58.1 kg)    SpO2 97%   BMI 22.67 kg/m  Physical Exam Vitals and nursing note reviewed.  Constitutional:      Appearance: Normal appearance.  HENT:     Head: Normocephalic.     Right Ear: Tympanic membrane normal.     Left Ear: Tympanic membrane normal.     Nose: Nose normal.     Mouth/Throat:     Mouth: Mucous membranes are moist.  Eyes:     Pupils: Pupils are equal, round, and reactive to light.  Cardiovascular:     Rate and Rhythm: Normal rate and regular rhythm.  Pulmonary:     Effort: Pulmonary effort is normal.     Breath sounds: Normal breath sounds.  Musculoskeletal:        General: Normal range of motion.     Cervical back: Normal range of motion.  Lymphadenopathy:     Head:     Right side of head: No submandibular, tonsillar, preauricular, posterior auricular or occipital adenopathy.     Left side of head: No submandibular, tonsillar, preauricular, posterior auricular or occipital adenopathy.     Cervical: No cervical adenopathy.  Skin:    General: Skin is warm and dry.  Neurological:     Mental Status: She is alert.  Psychiatric:        Mood and Affect: Mood normal.        Behavior: Behavior normal.      Assessment and Plan   Health Maintenance counseling: 1. Anticipatory guidance: Patient counseled regarding regular dental exams q6 months, eye exams,  avoiding smoking and second hand smoke, limiting alcohol to 1 beverage per day, no illicit drugs.   2. Risk factor reduction:  Advised patient of need for regular exercise and diet rich with fruits and vegetables to reduce risk of heart attack and stroke. Exercise- water aerobics at the [facility] 3d/week.  Wt Readings from Last 3 Encounters:  01/16/24 128 lb (58.1 kg)  11/14/23 129 lb 3.2 oz (58.6 kg)  11/27/22 126 lb (57.2 kg)   3. Immunizations/screenings/ancillary studies Immunization History  Administered Date(s) Administered   Fluad Quad(high Dose 65+) 04/14/2020, 05/08/2021, 03/27/2022   Influenza Split 04/24/2011,  04/15/2012, 04/14/2015   Influenza Whole 05/09/2010   Influenza, High Dose Seasonal PF 04/15/2018   Influenza,inj,Quad PF,6+ Mos 05/12/2013, 05/13/2014, 04/14/2015, 04/13/2016, 04/25/2017   Influenza-Unspecified 04/21/2019   PFIZER Comirnaty(Gray Top)Covid-19 Tri-Sucrose Vaccine 10/21/2020   PFIZER(Purple Top)SARS-COV-2 Vaccination 08/30/2019, 09/23/2019, 04/23/2020   Pfizer Covid-19 Vaccine Bivalent Booster 20yrs & up 06/20/2021   Pneumococcal Conjugate-13 05/13/2014   Pneumococcal Polysaccharide-23 05/09/2005, 05/13/2014   Zoster Recombinant(Shingrix) 03/05/2017, 05/15/2017   Zoster, Live 09/02/2007, 03/05/2017, 05/15/2017   Health Maintenance Due  Topic Date Due   Medicare Annual Wellness (AWV)  01/06/2023    4. Cervical cancer screening- N/A 5. Breast cancer screening-  mammogram last done 2022 6. Colon cancer screening -  Last done 2016 7. Skin cancer screening- advised regular sunscreen use. Denies worrisome, changing, or new skin lesions.  8. Birth control/STD check- N/A 9. Osteoporosis screening- done 2022 10. Alcohol screening: none 11. Smoking associated screening (lung cancer screening, AAA screen 65-75, UA)- never- smoked Assessment & Plan Fatigue Persistent symptoms for three years, worsening over the last year, attributed to Long COVID sx post-vaccination. Discussed early antiviral treatment (Paxlovid) for COVID-19 and flu. - Checking CBC lab today  Rectal Bleeding Rectal bleeding post-straining, likely due to hemorrhoids. Advised monitoring for pain, itching, or tenderness. - Monitor for symptoms of hemorrhoids. - Consider using Preparation H if symptoms develop.  Nasal Congestion  Persistent unilateral nasal congestion for several months. Suggested over-the-counter steroid nasal sprays. - Recommend OTC generic Flonase or Nasacort for nasal congestion if needed.  Gritty Eyes Gritty sensation in eyes without itching or drainage. Annual eye checkup scheduled;  consider earlier appointment if symptoms persist. - Consider earlier appointment with ophthalmologist if symptoms persist.  Osteoporosis Osteoporosis in the hip noted from 2022 bone scan. Emphasized vitamin D  for calcium  absorption and bone health post-menopause. - Try to add walking to exercise routine as many days as able, 15-20 minutes. - Be sure to take Vitamin D3, up to 5,000 units daily. - Check vitamin D  level. - Order new DEXA scan.  General Health Maintenance Engaged in health maintenance with supplements and water aerobics. Plans to evaluate liver and kidney function, calcium  and protein levels, thyroid  function, blood count for anemia, and cholesterol levels. - Check full panel CPE labs today.  Recommended follow up:  Return for AWV with Medicare team, any future concerns. No future appointments.   Lab/Order associations: non- fasting   Lucius Krabbe, NP

## 2024-01-16 NOTE — Patient Instructions (Addendum)
 It was very nice to see you today!   I will review your lab results via MyChart or a phone call in a few days.  I also have ordered a new bone density scan and the radiology office will call you to schedule.  Have a great rest of the summer!     PLEASE NOTE:  If you had any lab tests please let us  know if you have not heard back within a few days. You may see your results on MyChart before we have a chance to review them but we will give you a call once they are reviewed by us . If we ordered any referrals today, please let us  know if you have not heard from their office within the next week.

## 2024-01-17 ENCOUNTER — Encounter: Payer: Self-pay | Admitting: Family

## 2024-01-20 ENCOUNTER — Ambulatory Visit: Payer: Self-pay | Admitting: Family

## 2024-01-20 DIAGNOSIS — E782 Mixed hyperlipidemia: Secondary | ICD-10-CM

## 2024-01-20 NOTE — Progress Notes (Signed)
 please call Tina Smith and ask about the Crestor  - why is she not taking? I can send in a lower dose to start slowly if she would like. Let me know what she says - thx

## 2024-01-21 NOTE — Progress Notes (Signed)
 ok, will she try Pravastatin? This is the statin with least likelihood of causing joint pain and we can start the lowest dose and taken just 2-3 days per week can offer benefit.

## 2024-01-22 ENCOUNTER — Encounter: Payer: Self-pay | Admitting: Family

## 2024-01-22 DIAGNOSIS — Z789 Other specified health status: Secondary | ICD-10-CM | POA: Insufficient documentation

## 2024-01-22 DIAGNOSIS — M791 Myalgia, unspecified site: Secondary | ICD-10-CM | POA: Insufficient documentation

## 2024-02-13 ENCOUNTER — Encounter: Payer: Self-pay | Admitting: Family

## 2024-02-13 DIAGNOSIS — Z1283 Encounter for screening for malignant neoplasm of skin: Secondary | ICD-10-CM

## 2024-03-23 ENCOUNTER — Encounter: Payer: Self-pay | Admitting: Physician Assistant

## 2024-03-23 ENCOUNTER — Ambulatory Visit (INDEPENDENT_AMBULATORY_CARE_PROVIDER_SITE_OTHER): Admitting: Physician Assistant

## 2024-03-23 VITALS — BP 130/81 | HR 80

## 2024-03-23 DIAGNOSIS — W908XXA Exposure to other nonionizing radiation, initial encounter: Secondary | ICD-10-CM

## 2024-03-23 DIAGNOSIS — D229 Melanocytic nevi, unspecified: Secondary | ICD-10-CM

## 2024-03-23 DIAGNOSIS — D485 Neoplasm of uncertain behavior of skin: Secondary | ICD-10-CM

## 2024-03-23 DIAGNOSIS — D489 Neoplasm of uncertain behavior, unspecified: Secondary | ICD-10-CM

## 2024-03-23 DIAGNOSIS — Z1283 Encounter for screening for malignant neoplasm of skin: Secondary | ICD-10-CM | POA: Diagnosis not present

## 2024-03-23 DIAGNOSIS — L578 Other skin changes due to chronic exposure to nonionizing radiation: Secondary | ICD-10-CM

## 2024-03-23 DIAGNOSIS — D1801 Hemangioma of skin and subcutaneous tissue: Secondary | ICD-10-CM

## 2024-03-23 DIAGNOSIS — L820 Inflamed seborrheic keratosis: Secondary | ICD-10-CM

## 2024-03-23 DIAGNOSIS — L814 Other melanin hyperpigmentation: Secondary | ICD-10-CM | POA: Diagnosis not present

## 2024-03-23 DIAGNOSIS — L570 Actinic keratosis: Secondary | ICD-10-CM | POA: Diagnosis not present

## 2024-03-23 DIAGNOSIS — C4441 Basal cell carcinoma of skin of scalp and neck: Secondary | ICD-10-CM

## 2024-03-23 DIAGNOSIS — C44319 Basal cell carcinoma of skin of other parts of face: Secondary | ICD-10-CM

## 2024-03-23 DIAGNOSIS — L821 Other seborrheic keratosis: Secondary | ICD-10-CM

## 2024-03-23 NOTE — Progress Notes (Unsigned)
 New Patient Visit   Subjective  Tina Smith is a 83 y.o. female who presents for the following:  Total Body Skin Exam (TBSE)  Patient present today for new patient visit for TBSE.The patient reports she has spots, moles and lesions to be evaluated, some may be new or changing and the patient may have concern these could be cancer. Patient has not previously been treated by a dermatologist.Patient reports she does not have hx of bx. Patient denies family history of skin cancers. Patient reports throughout her lifetime has had minimal sun exposure. Currently, patient reports if she has excessive sun exposure, she does apply sunscreen and/or wears protective coverings.  The following portions of the chart were reviewed this encounter and updated as appropriate: medications, allergies, medical history  Review of Systems:  No other skin or systemic complaints except as noted in HPI or Assessment and Plan.  Objective  Well appearing patient in no apparent distress; mood and affect are within normal limits.  A full examination was performed including scalp, head, eyes, ears, nose, lips, neck, chest, axillae, abdomen, back, buttocks, bilateral upper extremities, bilateral lower extremities, hands, feet, fingers, toes, fingernails, and toenails. All findings within normal limits unless otherwise noted below.     Relevant exam findings are noted in the Assessment and Plan.  Right Temple 1.0 cm Pink scaly patch  Left Upper Cutaneous Lip 1.2 cm Pink scaly patch  Left Anterior Neck Pink scaly palque   Assessment & Plan   LENTIGINES, SEBORRHEIC KERATOSES, HEMANGIOMAS - Benign normal skin lesions - Benign-appearing - Call for any changes  MELANOCYTIC NEVI - Tan-brown and/or pink-flesh-colored symmetric macules and papules - Benign appearing on exam today - Observation - Call clinic for new or changing moles - Recommend daily use of broad spectrum spf 30+ sunscreen to sun-exposed  areas.   ACTINIC DAMAGE - Chronic condition, secondary to cumulative UV/sun exposure - diffuse scaly erythematous macules with underlying dyspigmentation - Recommend daily broad spectrum sunscreen SPF 30+ to sun-exposed areas, reapply every 2 hours as needed.  - Staying in the shade or wearing long sleeves, sun glasses (UVA+UVB protection) and wide brim hats (4-inch brim around the entire circumference of the hat) are also recommended for sun protection.  - Call for new or changing lesions.  SKIN CANCER SCREENING PERFORMED TODAY NEOPLASM OF UNCERTAIN BEHAVIOR (3) Right Temple Skin / nail biopsy Type of biopsy: tangential   Informed consent: discussed and consent obtained   Timeout: patient name, date of birth, surgical site, and procedure verified   Procedure prep:  Patient was prepped and draped in usual sterile fashion Prep type:  Isopropyl alcohol Anesthesia: the lesion was anesthetized in a standard fashion   Anesthetic:  1% lidocaine  w/ epinephrine 1-100,000 buffered w/ 8.4% NaHCO3 Instrument used: flexible razor blade   Hemostasis achieved with: pressure, aluminum chloride and electrodesiccation   Outcome: patient tolerated procedure well   Post-procedure details: sterile dressing applied and wound care instructions given   Dressing type: bandage and petrolatum    Specimen A - Surgical pathology Differential Diagnosis: SCC VS other  Check Margins: No Left Upper Cutaneous Lip Skin / nail biopsy Type of biopsy: tangential   Informed consent: discussed and consent obtained   Timeout: patient name, date of birth, surgical site, and procedure verified   Procedure prep:  Patient was prepped and draped in usual sterile fashion Prep type:  Isopropyl alcohol Anesthesia: the lesion was anesthetized in a standard fashion   Anesthetic:  1%  lidocaine  w/ epinephrine 1-100,000 buffered w/ 8.4% NaHCO3 Instrument used: flexible razor blade   Hemostasis achieved with: pressure, aluminum  chloride and electrodesiccation   Outcome: patient tolerated procedure well   Post-procedure details: sterile dressing applied and wound care instructions given   Dressing type: bandage and petrolatum    Specimen B - Surgical pathology Differential Diagnosis: BCC VS SCC VS other  Check Margins: No Left Anterior Neck Skin / nail biopsy Type of biopsy: tangential   Informed consent: discussed and consent obtained   Timeout: patient name, date of birth, surgical site, and procedure verified   Procedure prep:  Patient was prepped and draped in usual sterile fashion Prep type:  Isopropyl alcohol Anesthesia: the lesion was anesthetized in a standard fashion   Anesthetic:  1% lidocaine  w/ epinephrine 1-100,000 buffered w/ 8.4% NaHCO3 Instrument used: flexible razor blade   Hemostasis achieved with: pressure, aluminum chloride and electrodesiccation   Outcome: patient tolerated procedure well   Post-procedure details: sterile dressing applied and wound care instructions given   Dressing type: bandage and petrolatum    Specimen C - Surgical pathology Differential Diagnosis: SCC VS Other  Check Margins: No AK (ACTINIC KERATOSIS) (8) Left Forearm - Posterior (5), Right Dorsal Hand (3) Destruction of lesion - Left Forearm - Posterior (5), Right Dorsal Hand (3) Complexity: simple   Destruction method: cryotherapy   Informed consent: discussed and consent obtained   Timeout:  patient name, date of birth, surgical site, and procedure verified Lesion destroyed using liquid nitrogen: Yes   Region frozen until ice ball extended beyond lesion: Yes   Outcome: patient tolerated procedure well with no complications   Post-procedure details: wound care instructions given     Return in about 1 year (around 03/23/2025) for TBSE follow up. I, Doyce Pan, CMA, am acting as scribe for Mykira Hofmeister K, PA-C.   Documentation: I have reviewed the above documentation for accuracy and completeness,  and I agree with the above.  Nawaf Strange K, PA-C

## 2024-03-23 NOTE — Patient Instructions (Addendum)

## 2024-03-23 NOTE — Progress Notes (Deleted)
   Total Body Skin Exam (TBSE) Visit   Subjective  Tina Smith is a 83 y.o. female who presents for the following: Skin Cancer Screening and Full Body Skin Exam  Patient presents today for follow up visit for TBSE. Patient was last evaluated on *** . Patient {Actions; denies-reports:120008} medication changes. Patient reports she {Action; does/does not:19097} have spots, moles and lesions of concern to be evaluated. Patient reports throughout her lifetime she has had {no/min/mod:60509::no} sun exposure. Currently, patient reports if she has excessive sun exposure, she {DOES_DOES WNU:81435} apply sunscreen and/or wears protective coverings. Patient reports she {ACTIONS; HAS/DOES NOT HAVE:19233} hx of bx. Patient {Actions; denies/reports/admits to:19208}  family history of skin cancers. The patient has spots, moles and lesions to be evaluated, some may be new or changing and the patient has concerns that these could be cancer.  The following portions of the chart were reviewed this encounter and updated as appropriate: medications, allergies, medical history  Review of Systems:  No other skin or systemic complaints except as noted in HPI or Assessment and Plan.  Objective  Well appearing patient in no apparent distress; mood and affect are within normal limits.  A full examination was performed including scalp, head, eyes, ears, nose, lips, neck, chest, axillae, abdomen, back, buttocks, bilateral upper extremities, bilateral lower extremities, hands, feet, fingers, toes, fingernails, and toenails. All findings within normal limits unless otherwise noted below.   Relevant physical exam findings are noted in the Assessment and Plan.    Assessment & Plan   LENTIGINES, SEBORRHEIC KERATOSES, HEMANGIOMAS - Benign normal skin lesions - Benign-appearing - Call for any changes  MELANOCYTIC NEVI - Tan-brown and/or pink-flesh-colored symmetric macules and papules - Benign appearing on exam  today - Observation - Call clinic for new or changing moles - Recommend daily use of broad spectrum spf 30+ sunscreen to sun-exposed areas.   ACTINIC DAMAGE - Chronic condition, secondary to cumulative UV/sun exposure - diffuse scaly erythematous macules with underlying dyspigmentation - Recommend daily broad spectrum sunscreen SPF 30+ to sun-exposed areas, reapply every 2 hours as needed.  - Staying in the shade or wearing long sleeves, sun glasses (UVA+UVB protection) and wide brim hats (4-inch brim around the entire circumference of the hat) are also recommended for sun protection.  - Call for new or changing lesions.  SKIN CANCER SCREENING PERFORMED TODAY.     No follow-ups on file.  I, Doyce Pan, CMA, am acting as scribe for SANDRIDGE,BRENDA K, PA-C.  Documentation: I have reviewed the above documentation for accuracy and completeness, and I agree with the above.  SANDRIDGE,BRENDA K, PA-C

## 2024-03-25 LAB — SURGICAL PATHOLOGY

## 2024-04-01 ENCOUNTER — Ambulatory Visit: Payer: Self-pay | Admitting: Physician Assistant

## 2024-04-08 ENCOUNTER — Telehealth: Payer: Self-pay | Admitting: Physician Assistant

## 2024-04-08 NOTE — Telephone Encounter (Signed)
 Patient is requesting a call back. She was unsure about scheduling or doing the MOHS procedure. If she is able to get a call in explaining the importance of treatment. She is schedule for the MOHS.   Thank you!

## 2024-04-08 NOTE — Telephone Encounter (Signed)
 I attempted to call the patient to answer her questions about Mohs but unfortunately she didn't answer and there is no voicemail.

## 2024-04-09 NOTE — Telephone Encounter (Signed)
 I spoke with the patient today and, due to her age, she is thinking she would like to not have surgery. I advised her that it is a cancer and while it will not metastasize, it can be destructive in the area that it was biopsied. She states that she understands that but she is still not sure she wants to have it treated at this point. I offered to have Erminio Like, PA-C reach out to her to discuss the treatment options further but she declined. She will make a decision and let us  know.

## 2024-04-30 ENCOUNTER — Telehealth: Payer: Self-pay | Admitting: Dermatology

## 2024-04-30 NOTE — Telephone Encounter (Signed)
 Patient called to cancel MOHS surgery for 05/14/2024 and 05/28/2024. I asked if she was rescheduling but she stated she was just cancelling. Informed her that a nurse will reach out her to explain the risk of cancelling MOHS surgery. If she could please receive a call back.

## 2024-05-14 ENCOUNTER — Encounter: Admitting: Dermatology

## 2024-05-28 ENCOUNTER — Encounter: Admitting: Dermatology

## 2024-07-14 ENCOUNTER — Encounter: Admitting: Dermatology

## 2024-07-28 ENCOUNTER — Encounter: Admitting: Dermatology

## 2025-01-19 ENCOUNTER — Ambulatory Visit: Admitting: Family

## 2025-03-30 ENCOUNTER — Ambulatory Visit: Admitting: Physician Assistant
